# Patient Record
Sex: Female | Born: 1945 | Race: White | Hispanic: No | Smoking: Former smoker
Health system: Southern US, Community
[De-identification: ages and names within clinical notes are randomized; demographics above are authoritative.]

## PROBLEM LIST (undated history)

## (undated) DIAGNOSIS — I10 Essential (primary) hypertension: Secondary | ICD-10-CM

## (undated) DIAGNOSIS — R2689 Other abnormalities of gait and mobility: Secondary | ICD-10-CM

## (undated) DIAGNOSIS — G4730 Sleep apnea, unspecified: Secondary | ICD-10-CM

## (undated) DIAGNOSIS — M1990 Unspecified osteoarthritis, unspecified site: Secondary | ICD-10-CM

## (undated) DIAGNOSIS — E785 Hyperlipidemia, unspecified: Secondary | ICD-10-CM

## (undated) DIAGNOSIS — G5602 Carpal tunnel syndrome, left upper limb: Secondary | ICD-10-CM

## (undated) DIAGNOSIS — Z8719 Personal history of other diseases of the digestive system: Secondary | ICD-10-CM

## (undated) DIAGNOSIS — Z8673 Personal history of transient ischemic attack (TIA), and cerebral infarction without residual deficits: Secondary | ICD-10-CM

## (undated) DIAGNOSIS — G971 Other reaction to spinal and lumbar puncture: Secondary | ICD-10-CM

## (undated) DIAGNOSIS — K219 Gastro-esophageal reflux disease without esophagitis: Secondary | ICD-10-CM

## (undated) DIAGNOSIS — M25512 Pain in left shoulder: Secondary | ICD-10-CM

## (undated) DIAGNOSIS — E118 Type 2 diabetes mellitus with unspecified complications: Secondary | ICD-10-CM

## (undated) DIAGNOSIS — E1140 Type 2 diabetes mellitus with diabetic neuropathy, unspecified: Secondary | ICD-10-CM

## (undated) DIAGNOSIS — M5416 Radiculopathy, lumbar region: Secondary | ICD-10-CM

## (undated) DIAGNOSIS — E119 Type 2 diabetes mellitus without complications: Secondary | ICD-10-CM

## (undated) DIAGNOSIS — T4145XA Adverse effect of unspecified anesthetic, initial encounter: Secondary | ICD-10-CM

## (undated) HISTORY — PX: OTHER SURGICAL HISTORY: SHX169

## (undated) HISTORY — PX: TONSILLECTOMY: SUR1361

## (undated) HISTORY — DX: Personal history of transient ischemic attack (TIA), and cerebral infarction without residual deficits: Z86.73

## (undated) HISTORY — DX: Type 2 diabetes mellitus with diabetic neuropathy, unspecified: E11.40

## (undated) HISTORY — DX: Type 2 diabetes mellitus with unspecified complications: E11.8

## (undated) HISTORY — DX: Hyperlipidemia, unspecified: E78.5

## (undated) HISTORY — DX: Unspecified osteoarthritis, unspecified site: M19.90

## (undated) HISTORY — DX: Gastro-esophageal reflux disease without esophagitis: K21.9

## (undated) HISTORY — DX: Other abnormalities of gait and mobility: R26.89

## (undated) HISTORY — DX: Type 2 diabetes mellitus without complications: E11.9

## (undated) HISTORY — DX: Radiculopathy, lumbar region: M54.16

## (undated) HISTORY — DX: Essential (primary) hypertension: I10

---
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
## 1975-05-25 DIAGNOSIS — T8859XA Other complications of anesthesia, initial encounter: Secondary | ICD-10-CM

## 1975-05-25 DIAGNOSIS — G971 Other reaction to spinal and lumbar puncture: Secondary | ICD-10-CM

## 1975-05-25 HISTORY — DX: Other complications of anesthesia, initial encounter: T88.59XA

## 1975-05-25 HISTORY — DX: Other reaction to spinal and lumbar puncture: G97.1

## 2002-06-07 ENCOUNTER — Encounter: Payer: Self-pay | Admitting: Orthopedic Surgery

## 2002-06-07 ENCOUNTER — Encounter: Admission: RE | Admit: 2002-06-07 | Discharge: 2002-06-07 | Payer: Self-pay | Admitting: Orthopedic Surgery

## 2006-02-26 ENCOUNTER — Encounter: Admission: RE | Admit: 2006-02-26 | Discharge: 2006-02-26 | Payer: Self-pay | Admitting: Unknown Physician Specialty

## 2006-05-08 ENCOUNTER — Encounter: Admission: RE | Admit: 2006-05-08 | Discharge: 2006-05-08 | Payer: Self-pay | Admitting: Rheumatology

## 2006-06-06 ENCOUNTER — Ambulatory Visit: Payer: Self-pay | Admitting: Internal Medicine

## 2006-06-21 ENCOUNTER — Ambulatory Visit: Payer: Self-pay

## 2008-09-01 ENCOUNTER — Emergency Department (HOSPITAL_COMMUNITY): Admission: EM | Admit: 2008-09-01 | Discharge: 2008-09-01 | Payer: Self-pay | Admitting: Internal Medicine

## 2010-05-02 LAB — DIFFERENTIAL
Basophils Absolute: 0.1 10*3/uL (ref 0.0–0.1)
Eosinophils Absolute: 0.1 10*3/uL (ref 0.0–0.7)
Eosinophils Relative: 2 % (ref 0–5)
Lymphs Abs: 2.2 10*3/uL (ref 0.7–4.0)
Neutrophils Relative %: 61 % (ref 43–77)

## 2010-05-02 LAB — CBC
HCT: 41.6 % (ref 36.0–46.0)
MCV: 83.9 fL (ref 78.0–100.0)
Platelets: 383 10*3/uL (ref 150–400)
RDW: 14.2 % (ref 11.5–15.5)
WBC: 7.6 10*3/uL (ref 4.0–10.5)

## 2010-05-02 LAB — GLUCOSE, CAPILLARY: Glucose-Capillary: 100 mg/dL — ABNORMAL HIGH (ref 70–99)

## 2010-05-02 LAB — APTT: aPTT: 26 seconds (ref 24–37)

## 2010-05-02 LAB — PROTIME-INR
INR: 1 (ref 0.00–1.49)
Prothrombin Time: 12.6 seconds (ref 11.6–15.2)

## 2013-05-22 ENCOUNTER — Ambulatory Visit (INDEPENDENT_AMBULATORY_CARE_PROVIDER_SITE_OTHER): Payer: 59 | Admitting: Surgery

## 2013-05-22 ENCOUNTER — Encounter (INDEPENDENT_AMBULATORY_CARE_PROVIDER_SITE_OTHER): Payer: Self-pay | Admitting: Surgery

## 2013-05-22 VITALS — BP 142/80 | HR 80 | Temp 97.8°F | Resp 18 | Ht 60.0 in | Wt 211.4 lb

## 2013-05-22 DIAGNOSIS — E119 Type 2 diabetes mellitus without complications: Secondary | ICD-10-CM

## 2013-05-22 DIAGNOSIS — I10 Essential (primary) hypertension: Secondary | ICD-10-CM

## 2013-05-22 DIAGNOSIS — K219 Gastro-esophageal reflux disease without esophagitis: Secondary | ICD-10-CM

## 2013-05-22 DIAGNOSIS — E1129 Type 2 diabetes mellitus with other diabetic kidney complication: Secondary | ICD-10-CM | POA: Insufficient documentation

## 2013-05-22 DIAGNOSIS — E785 Hyperlipidemia, unspecified: Secondary | ICD-10-CM | POA: Insufficient documentation

## 2013-05-22 HISTORY — DX: Hyperlipidemia, unspecified: E78.5

## 2013-05-22 HISTORY — DX: Essential (primary) hypertension: I10

## 2013-05-22 HISTORY — DX: Type 2 diabetes mellitus with other diabetic kidney complication: E11.29

## 2013-05-22 NOTE — Progress Notes (Signed)
Chief Complaint:  Morbid obesity BMI 41.5  History of Present Illness:  Tina Ortega is an 68 y.o. female who has worked 2 jobs for over 30 years to raise her family by herself.  She has DM and is followed by Dr. Alyson LocketImaron Haque in [place].  She has been to our seminar and wants to proceed with a bariatric intervention.  We discussed the options and she would like to pursue sleeve gastrectomy to assist with management of her DM.  Will proceed with workup.   Past Medical History  Diagnosis Date  . GERD (gastroesophageal reflux disease)   . Hypertension   . Hyperlipidemia   . Diabetes mellitus without complication     Past Surgical History  Procedure Laterality Date  . Ovarian cyst surgery  1965  . Appendectomy  1965  . Cesarean section  1975, 1977    Current Outpatient Prescriptions  Medication Sig Dispense Refill  . DULoxetine (CYMBALTA) 60 MG capsule       . meloxicam (MOBIC) 15 MG tablet       . NEXIUM 40 MG capsule       . ONGLYZA 5 MG TABS tablet        No current facility-administered medications for this visit.   Penicillins Family History  Problem Relation Age of Onset  . Diabetes Mother   . Heart disease Mother   . Arthritis Father   . Arthritis Sister    Social History:   reports that she quit smoking about 28 years ago. She does not have any smokeless tobacco history on file. She reports that she does not use illicit drugs. Her alcohol history is not on file.   REVIEW OF SYSTEMS - PERTINENT POSITIVES ONLY: No history of DVT;  2 c sections  Positive for night sweats, blood pressure, kidney stones, rectal bleeding, reflux hoarseness and sore throat, glasses, otherwise negative  Physical Exam:   Blood pressure 142/80, pulse 80, temperature 97.8 F (36.6 C), temperature source Temporal, resp. rate 18, height 5' (1.524 m), weight 211 lb 6.4 oz (95.89 kg). Body mass index is 41.29 kg/(m^2).  Gen:  WDWN WF NAD  Neurological: Alert and oriented to person, place,  and time. Motor and sensory function is grossly intact  Head: Normocephalic and atraumatic.  Eyes: Conjunctivae are normal. Pupils are equal, round, and reactive to light. No scleral icterus.  Neck: Normal range of motion. Neck supple. No tracheal deviation or thyromegaly present.  Cardiovascular:  SR without murmurs or gallops.  No carotid bruits Respiratory: Effort normal.  No respiratory distress. No chest wall tenderness. Breath sounds normal.  No wheezes, rales or rhonchi.  Abdomen:  Obese-centripedal GU: Musculoskeletal: Normal range of motion. Extremities are nontender. No cyanosis, edema or clubbing noted Lymphadenopathy: No cervical, preauricular, postauricular or axillary adenopathy is present Skin: Skin is warm and dry. No rash noted. No diaphoresis. No erythema. No pallor. Pscyh: Normal mood and affect. Behavior is normal. Judgment and thought content normal.   LABORATORY RESULTS: No results found for this or any previous visit (from the past 48 hour(s)).  RADIOLOGY RESULTS: No results found.  Problem List: Patient Active Problem List   Diagnosis Date Noted  . Diabetes 05/22/2013  . GERD (gastroesophageal reflux disease) 05/22/2013  . Other and unspecified hyperlipidemia 05/22/2013  . Essential hypertension, benign 05/22/2013    Assessment & Plan: Younger and physiologically in better shape that age.  Will move toward lap sleeve gastrectomy    Matt B. Daphine DeutscherMartin, MD, FACS  [facility] [phone] beeper [phone]  05/22/2013 4:59 PM

## 2013-05-22 NOTE — Patient Instructions (Signed)
Sleeve Gastrectomy A sleeve gastrectomy is a surgery in which a large portion of the stomach is removed. After the surgery, the stomach will be a narrow tube about the size of a banana. This surgery is performed to help a person lose weight. The person loses weight because the reduced size of the stomach restricts the amount of food that the person can eat. The stomach will hold much less food than before the surgery. Also, the part of the stomach that is removed produces a hormone that causes hunger.  This surgery is done for people who have morbid obesity, defined as a body mass index (BMI) greater than 40. BMI is an estimate of body fat and is calculated from the height and weight of a person. This surgery may also be done for people with a BMI between 35 and 40 if they have other diseases, such as type 2 diabetes mellitus, obstructive sleep apnea, or heart and lung disorders (cardiopulmonary diseases).  LET YOUR HEALTH CARE PROVIDER KNOW ABOUT:  Any allergies you have.   All medicines you are taking, including vitamins, herbs, eyedrops, creams, and over-the-counter medicines.   Use of steroids (by mouth or creams).   Previous problems you or members of your family have had with the use of anesthetics.   Any blood disorders you have.   Previous surgeries you have had.   Possibility of pregnancy, if this applies.   Other health problems you have. RISKS AND COMPLICATIONS Generally, sleeve gastrectomy is a safe procedure. However, as with any procedure, complications can occur. Possible complications include:  Infection.  Bleeding.  Blood clots.  Damage to other organs or tissue.  Leakage of fluid from the stomach into the abdominal cavity (rare). BEFORE THE PROCEDURE  You may need to have blood tests and imaging tests (such as X-rays or ultrasonography) done before the day of surgery. A test to evaluate your esophagus and how it moves (esophageal manometry) may also be  done.  You may be placed on a liquid diet 2 3 weeks before the surgery.  Ask your health care provider about changing or stopping your regular medicines.  Do not eat or drink anything for at least 8 hours before the procedure.   Make plans to have someone drive you home after your hospital stay. Also arrange for someone to help you with activities during recovery. PROCEDURE  A laparoscopic technique is usually used for this surgery:  You will be given medicine to make you sleep through the procedure (general anesthetic). This medicine will be given through an intravenous (IV) access tube that is put into one of your veins.  Once you are asleep, your abdomen will be cleaned and sterilized.  Several small incisions will be made in your abdomen.  Your abdomen will be filled with air so that it expands. This gives the surgeon more room to operate and makes your organs easier to see.  A thin, lighted tube with a tiny camera on the end (laparoscope) is put through a small incision in your abdomen. The camera on the laparoscope sends a picture to a TV screen in the operating room. This gives the surgeon a good view inside the abdomen.  Hollow tubes are put through the other small incisions in your abdomen. The tools needed for the procedure are put through these tubes.  The surgeon uses staples to divide part of the stomach and then removes it through one of the incisions.  The remaining stomach may be reinforced using   stitches or surgical glue or both to prevent leakage of the stomach contents. A small tube (drain) may be placed through one of the incisions to allow extra fluid to flow from the area.  The incisions are closed with stitches, staples, or glue. AFTER THE PROCEDURE  You will be monitored closely in a recovery area. Once the anesthetic has worn off, you will likely be moved to a regular hospital room.  You will be given medicine for pain and nausea.   You may have a drain  from one of the incisions in your abdomen. If a drain is used, it may stay in place after you go home from the hospital and be removed at a follow-up appointment.   You will be encouraged to walk around several times a day. This helps prevent blood clots.  You will be started on a liquid diet the first day after your surgery. Sometimes a test is done to check for leaking before you can eat.  You will be urged to cough and do deep breathing exercises. This helps prevent a lung infection after a surgery.  You will likely need to stay in the hospital for a few days.  Document Released: 11/07/2008 Document Revised: 09/12/2012 Document Reviewed: 05/25/2012 ExitCare Patient Information 2014 [employer].  

## 2013-05-22 NOTE — Addendum Note (Signed)
Addended by: Maryan PulsMOORE, Mayvis Agudelo on: 05/22/2013 05:21 PM   Modules accepted: Orders

## 2013-05-24 NOTE — Addendum Note (Signed)
Addended by: Maryan PulsMOORE, Linzee Depaul on: 05/24/2013 04:29 PM   Modules accepted: Orders

## 2013-06-19 ENCOUNTER — Encounter: Payer: Self-pay | Admitting: Dietician

## 2013-06-19 ENCOUNTER — Encounter: Payer: 59 | Attending: Surgery | Admitting: Dietician

## 2013-06-19 VITALS — Ht 61.0 in | Wt 213.2 lb

## 2013-06-19 DIAGNOSIS — Z713 Dietary counseling and surveillance: Secondary | ICD-10-CM | POA: Insufficient documentation

## 2013-06-19 DIAGNOSIS — E669 Obesity, unspecified: Secondary | ICD-10-CM

## 2013-06-19 DIAGNOSIS — Z01818 Encounter for other preprocedural examination: Secondary | ICD-10-CM | POA: Insufficient documentation

## 2013-06-19 NOTE — Progress Notes (Signed)
  Pre-Op Assessment Visit:  Pre-Operative Sleeve Gastrectomy Surgery  Medical Nutrition Therapy:  Appt start time: 1630   End time:  1715.  Patient was seen on 06/19/2013 for Pre-Operative Sleeve Gastrectomy Nutrition Assessment. Assessment and letter of approval faxed to [facility] Bariatric Surgery Program coordinator on 06/19/2013.   Preferred Learning Style:   No preference indicated   Learning Readiness:   Ready  Handouts given during visit include:  Pre-Op Goals Bariatric Surgery Protein Shakes  Supplements given during visit include:  Premier Chocolate lot # [id number], exp 01/2014 Unjury Chocolate lot #[id number], exp 01/25/2014  Teaching Method Utilized:  Visual Auditory Hands on  Barriers to learning/adherence to lifestyle change: none  Demonstrated degree of understanding via:  Teach Back   Patient to call the [facility] to enroll in Pre-Op and Post-Op Nutrition Education when surgery date is scheduled.

## 2013-06-19 NOTE — Patient Instructions (Signed)
Start working on [employer]. Try protein shakes. Call [facility] when surgery is scheduled to enroll in Pre-Op Class.

## 2013-06-20 LAB — CBC WITH DIFFERENTIAL/PLATELET
BASOS PCT: 0 % (ref 0–1)
Basophils Absolute: 0 10*3/uL (ref 0.0–0.1)
EOS ABS: 0.1 10*3/uL (ref 0.0–0.7)
EOS PCT: 2 % (ref 0–5)
HEMATOCRIT: 43.4 % (ref 36.0–46.0)
HEMOGLOBIN: 14.5 g/dL (ref 12.0–15.0)
Lymphocytes Relative: 31 % (ref 12–46)
Lymphs Abs: 2.3 10*3/uL (ref 0.7–4.0)
MCH: 28.9 pg (ref 26.0–34.0)
MCHC: 33.4 g/dL (ref 30.0–36.0)
MCV: 86.6 fL (ref 78.0–100.0)
MONO ABS: 0.5 10*3/uL (ref 0.1–1.0)
MONOS PCT: 7 % (ref 3–12)
NEUTROS ABS: 4.4 10*3/uL (ref 1.7–7.7)
Neutrophils Relative %: 60 % (ref 43–77)
Platelets: 273 10*3/uL (ref 150–400)
RBC: 5.01 MIL/uL (ref 3.87–5.11)
RDW: 13.6 % (ref 11.5–15.5)
WBC: 7.3 10*3/uL (ref 4.0–10.5)

## 2013-06-20 LAB — T4: T4 TOTAL: 8.2 ug/dL (ref 5.0–12.5)

## 2013-06-20 LAB — LIPID PANEL
CHOLESTEROL: 195 mg/dL (ref 0–200)
HDL: 39 mg/dL — ABNORMAL LOW (ref >39)
LDL CALC: 108 mg/dL — AB (ref 0–99)
Total CHOL/HDL Ratio: 5 Ratio
Triglycerides: 238 mg/dL — ABNORMAL HIGH (ref <150)
VLDL: 48 mg/dL — AB (ref 0–40)

## 2013-06-20 LAB — HEMOGLOBIN A1C
HEMOGLOBIN A1C: 6.9 % — AB (ref <5.7)
Mean Plasma Glucose: 151 mg/dL — ABNORMAL HIGH (ref <117)

## 2013-06-20 LAB — H. PYLORI ANTIBODY, IGG: H Pylori IgG: <0.4 {ISR}

## 2013-06-20 LAB — COMPREHENSIVE METABOLIC PANEL
ALBUMIN: 4.4 g/dL (ref 3.5–5.2)
ALK PHOS: 110 U/L (ref 39–117)
ALT: 17 U/L (ref 0–35)
AST: 16 U/L (ref 0–37)
BUN: 21 mg/dL (ref 6–23)
CO2: 30 mEq/L (ref 19–32)
Calcium: 9 mg/dL (ref 8.4–10.5)
Chloride: 102 mEq/L (ref 96–112)
Creat: 0.79 mg/dL (ref 0.50–1.10)
GLUCOSE: 169 mg/dL — AB (ref 70–99)
POTASSIUM: 4.2 meq/L (ref 3.5–5.3)
Sodium: 138 mEq/L (ref 135–145)
Total Bilirubin: 0.6 mg/dL (ref 0.2–1.2)
Total Protein: 6.8 g/dL (ref 6.0–8.3)

## 2013-06-20 LAB — TSH: TSH: 2.816 u[IU]/mL (ref 0.350–4.500)

## 2013-06-21 ENCOUNTER — Ambulatory Visit (HOSPITAL_BASED_OUTPATIENT_CLINIC_OR_DEPARTMENT_OTHER): Payer: 59 | Attending: Surgery | Admitting: Radiology

## 2013-06-21 VITALS — Ht 60.0 in | Wt 213.0 lb

## 2013-06-21 DIAGNOSIS — I10 Essential (primary) hypertension: Secondary | ICD-10-CM

## 2013-06-21 DIAGNOSIS — G4733 Obstructive sleep apnea (adult) (pediatric): Secondary | ICD-10-CM | POA: Insufficient documentation

## 2013-06-21 DIAGNOSIS — E785 Hyperlipidemia, unspecified: Secondary | ICD-10-CM

## 2013-06-21 DIAGNOSIS — E119 Type 2 diabetes mellitus without complications: Secondary | ICD-10-CM

## 2013-06-21 DIAGNOSIS — K219 Gastro-esophageal reflux disease without esophagitis: Secondary | ICD-10-CM

## 2013-06-24 ENCOUNTER — Other Ambulatory Visit: Payer: Self-pay

## 2013-06-24 ENCOUNTER — Encounter (INDEPENDENT_AMBULATORY_CARE_PROVIDER_SITE_OTHER): Payer: Self-pay

## 2013-06-24 ENCOUNTER — Ambulatory Visit (HOSPITAL_COMMUNITY)
Admission: RE | Admit: 2013-06-24 | Discharge: 2013-06-24 | Disposition: A | Discharge status: Home / Self Care | Payer: 59 | Source: Ambulatory Visit | Attending: Surgery | Admitting: Surgery

## 2013-06-24 DIAGNOSIS — E119 Type 2 diabetes mellitus without complications: Secondary | ICD-10-CM

## 2013-06-24 DIAGNOSIS — Z6841 Body Mass Index (BMI) 40.0 and over, adult: Secondary | ICD-10-CM | POA: Insufficient documentation

## 2013-06-24 DIAGNOSIS — K219 Gastro-esophageal reflux disease without esophagitis: Secondary | ICD-10-CM

## 2013-06-24 DIAGNOSIS — K449 Diaphragmatic hernia without obstruction or gangrene: Secondary | ICD-10-CM | POA: Insufficient documentation

## 2013-06-24 DIAGNOSIS — E785 Hyperlipidemia, unspecified: Secondary | ICD-10-CM

## 2013-06-24 DIAGNOSIS — I10 Essential (primary) hypertension: Secondary | ICD-10-CM

## 2013-06-24 DIAGNOSIS — J9819 Other pulmonary collapse: Secondary | ICD-10-CM | POA: Insufficient documentation

## 2013-06-24 DIAGNOSIS — I517 Cardiomegaly: Secondary | ICD-10-CM | POA: Insufficient documentation

## 2013-06-24 LAB — VITAMIN D 1,25 DIHYDROXY
VITAMIN D3 1, 25 (OH): 96 pg/mL
Vitamin D 1, 25 (OH)2 Total: 96 pg/mL — ABNORMAL HIGH (ref 18–72)

## 2013-06-29 ENCOUNTER — Ambulatory Visit: Payer: Self-pay | Admitting: Dietician

## 2013-07-04 DIAGNOSIS — G4730 Sleep apnea, unspecified: Secondary | ICD-10-CM

## 2013-07-04 DIAGNOSIS — G4710 Hypersomnia, unspecified: Secondary | ICD-10-CM

## 2013-07-04 NOTE — Sleep Study (Signed)
   NAME: Tina Ortega DATE OF BIRTH:  01/08/1946 MEDICAL RECORD NUMBER [id number]  LOCATION: [facility]  PHYSICIAN: Barbaraann Share  DATE OF STUDY: 06/21/2013  SLEEP STUDY TYPE: Nocturnal Polysomnogram               REFERRING PHYSICIAN: Valarie Merino, MD  INDICATION FOR STUDY: Hypersomnia with sleep apnea  EPWORTH SLEEPINESS SCORE:  8 HEIGHT: 5' (152.4 cm)  WEIGHT: 213 lb (96.616 kg)    Body mass index is 41.6 kg/(m^2).  NECK SIZE: 16.5 in.  MEDICATIONS: Reviewed in the sleep record  SLEEP ARCHITECTURE: The patient had a total sleep time of 436 minutes, with no slow-wave sleep and decreased quantity of REM. Sleep onset latency was normal at 22 minutes, and REM was not achieved until the titration portion of the study. Sleep efficiency was 88% during the diagnostic portion, and 93% during the titration portion of the study.  RESPIRATORY DATA: The patient underwent a split night study where she was found to have 43 obstructive events in the first 171 minutes of sleep. This gave her an AHI of 15 events per hour during the diagnostic portion of the study. The events occurred in all body positions, and there was moderate snoring noted throughout. The patient was then fitted with a small ResMed air fit F10 full face mask, and CPAP titration was initiated. She was found to have an optimal pressure of 12 cm of water, even through supine REM.  OXYGEN DATA: There was transient oxygen desaturation as low as 86% with the patient's obstructive events  CARDIAC DATA: Rare PVC noted  MOVEMENT/PARASOMNIA: The patient had no significant limb movements or other abnormal behaviors noted.  IMPRESSION/ RECOMMENDATION:    1) split-night study reveals mild obstructive sleep apnea, with an AHI of 15 events per hour and oxygen desaturation as low as 86% during the diagnostic portion of the study. The patient was then fitted with a small ResMed air fit F10 full face mask, and  found to have an optimal CPAP pressure of 12 cm of water. She should also be encouraged to work aggressively on weight loss. Alternative treatments for mild sleep apnea can include a trial of weight loss alone, upper airway surgery, dental appliance, and CPAP as stated above. Clinical correlation is suggested.  2) rare PVC noted, but no clinically significant arrhythmias were seen     Barbaraann Share Diplomate, American Board of Sleep Medicine  ELECTRONICALLY SIGNED ON:  07/04/2013, 3:07 PM [facility] PH: [phone]   FX: [phone] ACCREDITED BY THE AMERICAN ACADEMY OF SLEEP MEDICINE

## 2013-07-12 ENCOUNTER — Encounter: Payer: Self-pay | Admitting: Pulmonary Disease

## 2013-07-12 ENCOUNTER — Ambulatory Visit (INDEPENDENT_AMBULATORY_CARE_PROVIDER_SITE_OTHER): Payer: Self-pay | Admitting: Pulmonary Disease

## 2013-07-12 ENCOUNTER — Encounter (HOSPITAL_BASED_OUTPATIENT_CLINIC_OR_DEPARTMENT_OTHER): Payer: Self-pay

## 2013-07-12 VITALS — BP 140/82 | HR 99 | Temp 97.8°F | Ht 60.0 in | Wt 215.2 lb

## 2013-07-12 DIAGNOSIS — G4733 Obstructive sleep apnea (adult) (pediatric): Secondary | ICD-10-CM

## 2013-07-12 HISTORY — DX: Obstructive sleep apnea (adult) (pediatric): G47.33

## 2013-07-12 NOTE — Assessment & Plan Note (Signed)
The patient has mild obstructive sleep apnea by her recent sleep study, but is only mildly symptomatic overall. The good news here is that her degree of sleep apnea represents very little cardiovascular risk going forward. I suspect that she will respond very quickly to bariatric surgery, and that her sleep apnea will resolve with her first weight loss response. However, I have also talked with her about the advantages of CPAP in the postop period, especially while getting pain medications. After a long discussion, the patient would like to hold off on CPAP if possible, but would be willing to wear for a period of time if her surgeon feels strongly about it.

## 2013-07-12 NOTE — Patient Instructions (Signed)
Work on weight reduction after your surgery. Let me know if you change your mind, and would like to try cpap.   Will send a note to Dr. Daphine DeutscherMartin, and will let you know if he feels strongly about you wearing cpap after you surgery.

## 2013-07-12 NOTE — Progress Notes (Signed)
Subjective:    Patient ID: Tina Ortega, female    DOB: 1945-03-26, 68 y.o.   MRN: [id number]  HPI The patient is a 68 year old female who I've been asked to see for management of obstructive sleep apnea. She has had a recent sleep study which showed mild OSA, with an AHI of 15 events per hour. She was treated with CPAP, and found to have an optimal pressure of 12 cm of water. The patient has been noted to have loud snoring, but no one sleeps with her on a consistent basis to comment on observed apneas. She denies any choking arousals. She rarely awakens during the night, and is rested the majority of the mornings. She has very mild sleep pressure during the day with inactivity, and some in the evening watching television. She works 2 jobs and starts her day very early. She denies any sleepiness with driving. She states her weight is up 50 pounds over the last 2 years, and her Epworth score today is 7.   Sleep Questionnaire What time do you typically go to bed?( Between what hours) 9-10pm 9-10pm at 1509 on 07/12/13 by Darrell JewelJennifer R Castillo, CMA How long does it take you to fall asleep? 5 minutes 5 minutes at 1509 on 07/12/13 by Darrell JewelJennifer R Castillo, CMA How many times during the night do you wake up? 1 1 at 1509 on 07/12/13 by Darrell JewelJennifer R Castillo, CMA What time do you get out of bed to start your day? 0430 0430 at 1509 on 07/12/13 by Darrell JewelJennifer R Castillo, CMA Do you drive or operate heavy machinery in your occupation? No No at 1509 on 07/12/13 by Darrell JewelJennifer R Castillo, CMA How much has your weight changed (up or down) over the past two years? (In pounds) 50 lb (22.68 kg) 50 lb (22.68 kg) at 1509 on 07/12/13 by Darrell JewelJennifer R Castillo, CMA Have you ever had a sleep study before? Yes Yes at 1509 on 07/12/13 by Darrell JewelJennifer R Castillo, CMA If yes, location of study? [place] at 1509 on 07/12/13 by Darrell JewelJennifer R Castillo, CMA If yes, date of study? [id number] at 1509 on  07/12/13 by Darrell JewelJennifer R Castillo, CMA Do you currently use CPAP? No No at 1509 on 07/12/13 by Darrell JewelJennifer R Castillo, CMA Do you wear oxygen at any time? No    Review of Systems  Constitutional: Negative for fever and unexpected weight change.  HENT: Negative for congestion, dental problem, ear pain, nosebleeds, postnasal drip, rhinorrhea, sinus pressure, sneezing, sore throat and trouble swallowing.   Eyes: Negative for redness and itching.  Respiratory: Negative for cough, chest tightness, shortness of breath and wheezing.   Cardiovascular: Negative for palpitations and leg swelling.  Gastrointestinal: Negative for nausea and vomiting.  Genitourinary: Negative for dysuria.  Musculoskeletal: Negative for joint swelling.  Skin: Negative for rash.  Neurological: Negative for headaches.  Hematological: Does not bruise/bleed easily.  Psychiatric/Behavioral: Positive for dysphoric mood. The patient is not nervous/anxious.        Objective:   Physical Exam Constitutional:  Obese female, no acute distress  HENT:  Nares patent without discharge  Oropharynx without exudate, palate and uvula are mildly elongated  Eyes:  Perrla, eomi, no scleral icterus  Neck:  No JVD, no TMG  Cardiovascular:  Normal rate, regular rhythm, no rubs or gallops.  No murmurs        Intact distal pulses  Pulmonary :  Normal breath sounds, no stridor or respiratory distress   No rales,  rhonchi, or wheezing  Abdominal:  Soft, nondistended, bowel sounds present.  No tenderness noted.   Musculoskeletal:  mild lower extremity edema noted.  Lymph Nodes:  No cervical lymphadenopathy noted  Skin:  No cyanosis noted  Neurologic:  Alert, appropriate, moves all 4 extremities without obvious deficit.         Assessment & Plan:

## 2013-09-02 ENCOUNTER — Encounter: Payer: 59 | Attending: Surgery

## 2013-09-02 VITALS — Ht 61.0 in | Wt 209.0 lb

## 2013-09-02 DIAGNOSIS — Z713 Dietary counseling and surveillance: Secondary | ICD-10-CM | POA: Insufficient documentation

## 2013-09-02 DIAGNOSIS — E669 Obesity, unspecified: Secondary | ICD-10-CM

## 2013-09-02 DIAGNOSIS — Z01818 Encounter for other preprocedural examination: Secondary | ICD-10-CM | POA: Diagnosis not present

## 2013-09-05 NOTE — Progress Notes (Signed)
  Pre-Operative Nutrition Class:  Appt start time: 8469   End time:  1830.  Patient was seen on 09/02/13 for Pre-Operative Bariatric Surgery Education at [facility].   Surgery date: 10/01/13 Surgery type: Gastric sleeve Start weight at [facility]: 213 lbs on 06/19/13 Weight today: 209 lbs  TANITA  BODY COMP RESULTS  None for pre op class   BMI (kg/m^2)    Fat Mass (lbs)    Fat Free Mass (lbs)    Total Body Water (lbs)    Samples given per MNT protocol. Patient educated on appropriate usage: Unjury protein powder (unflavored - qty 1) Lot #: [id number] Exp: 10/2014  Bariactiv Calcium Citrate (qty 1) Lot #: [id number] Exp: 06/2014  Celebrate Vitamins Multivitamin (qty 1) Lot #: [id number] Exp: 04/2014  Premier protein shake (chocolate - qty 1) Lot #: [id number] Exp: 04/2014   The following the learning objectives were met by the patient during this course:  Identify Pre-Op Dietary Goals and will begin 2 weeks pre-operatively  Identify appropriate sources of fluids and proteins   State protein recommendations and appropriate sources pre and post-operatively  Identify Post-Operative Dietary Goals and will follow for 2 weeks post-operatively  Identify appropriate multivitamin and calcium sources  Describe the need for physical activity post-operatively and will follow MD recommendations  State when to call healthcare provider regarding medication questions or post-operative complications  Handouts given during class include:  Pre-Op Bariatric Surgery Diet Handout  Protein Shake Handout  Post-Op Bariatric Surgery Nutrition Handout  [facility] Program Information Flyer  Support Group Information Flyer  [facility] Bariatric Supplements Price List  Follow-Up Plan: Patient will follow-up at [facility] 2 weeks post operatively for diet advancement per MD.

## 2013-09-18 NOTE — Progress Notes (Signed)
To Dr. Daphine Deutscher - Please enter preop orders in epic for Tina Ortega - she is coming to [facility] on 9/4 for preop / labs.  Thanks.

## 2013-09-24 ENCOUNTER — Encounter (HOSPITAL_COMMUNITY): Payer: Self-pay | Admitting: Pharmacy Technician

## 2013-09-25 ENCOUNTER — Encounter (INDEPENDENT_AMBULATORY_CARE_PROVIDER_SITE_OTHER): Payer: Self-pay

## 2013-09-25 ENCOUNTER — Other Ambulatory Visit (INDEPENDENT_AMBULATORY_CARE_PROVIDER_SITE_OTHER): Payer: Self-pay | Admitting: Surgery

## 2013-09-25 ENCOUNTER — Ambulatory Visit (INDEPENDENT_AMBULATORY_CARE_PROVIDER_SITE_OTHER): Payer: 59 | Admitting: Surgery

## 2013-09-25 NOTE — Progress Notes (Signed)
Chief Complaint: Morbid obesity BMI 41.5  History of Present Illness: Tina Ortega is an 68 y.o. female who has worked 2 jobs for over 30 years to raise her family by herself. She has DM and is followed by Dr. Alyson Locket in [place]. She has been to our seminar and wants to proceed with a bariatric intervention. We discussed the options and she would like to pursue sleeve gastrectomy to assist with management of her DM. Workup showed small sliding hiatus hernia on UGI.  Informed consent obtained.   completed.    Past Medical History   Diagnosis  Date   .  GERD (gastroesophageal reflux disease)    .  Hypertension    .  Hyperlipidemia    .  Diabetes mellitus without complication     Past Surgical History   Procedure  Laterality  Date   .  Ovarian cyst surgery   1965   .  Appendectomy   1965   .  Cesarean section   1975, 1977    Current Outpatient Prescriptions   Medication  Sig  Dispense  Refill   .  DULoxetine (CYMBALTA) 60 MG capsule      .  meloxicam (MOBIC) 15 MG tablet      .  NEXIUM 40 MG capsule      .  ONGLYZA 5 MG TABS tablet       No current facility-administered medications for this visit.   Penicillins  Family History   Problem  Relation  Age of Onset   .  Diabetes  Mother    .  Heart disease  Mother    .  Arthritis  Father    .  Arthritis  Sister    Social History: reports that she quit smoking about 28 years ago. She does not have any smokeless tobacco history on file. She reports that she does not use illicit drugs. Her alcohol history is not on file.  REVIEW OF SYSTEMS - PERTINENT POSITIVES ONLY:  No history of DVT; 2 c sections Positive for night sweats, blood pressure, kidney stones, rectal bleeding, reflux hoarseness and sore throat, glasses, otherwise negative  Physical Exam:  Blood pressure 142/80, pulse 80, temperature 97.8 F (36.6 C), temperature source Temporal, resp. rate 18, height 5' (1.524 m), weight 211 lb 6.4 oz (95.89 kg).  Body mass index is  41.29 kg/(m^2).  Gen: WDWN WF NAD  Neurological: Alert and oriented to person, place, and time. Motor and sensory function is grossly intact  Head: Normocephalic and atraumatic.  Eyes: Conjunctivae are normal. Pupils are equal, round, and reactive to light. No scleral icterus.  Neck: Normal range of motion. Neck supple. No tracheal deviation or thyromegaly present.  Cardiovascular: SR without murmurs or gallops. No carotid bruits  Respiratory: Effort normal. No respiratory distress. No chest wall tenderness. Breath sounds normal. No wheezes, rales or rhonchi.  Abdomen: Obese-centripedal  GU:  Musculoskeletal: Normal range of motion. Extremities are nontender. No cyanosis, edema or clubbing noted Lymphadenopathy: No cervical, preauricular, postauricular or axillary adenopathy is present Skin: Skin is warm and dry. No rash noted. No diaphoresis. No erythema. No pallor. Pscyh: Normal mood and affect. Behavior is normal. Judgment and thought content normal.  LABORATORY RESULTS:  No results found for this or any previous visit (from the past 48 hour(s)).  RADIOLOGY RESULTS:  No results found.  Problem List:  Patient Active Problem List    Diagnosis  Date Noted   .  Diabetes  05/22/2013   .  GERD (gastroesophageal reflux disease)  05/22/2013   .  Other and unspecified hyperlipidemia  05/22/2013   .  Essential hypertension, benign  05/22/2013   Assessment & Plan:  Younger and physiologically in better shape that age. Ready for sleeve gastrectomy.  UGI showed small sliding hiatua hernia.  I have discussed the procedure again with her in detail.  She is ready for surgery next week.  Matt B. Daphine Deutscher, MD, [facility]  [phone] beeper  [phone]

## 2013-09-26 NOTE — Patient Instructions (Addendum)
20 Tina Ortega  09/26/2013   Your procedure is scheduled on: Tuesday September 8th, 2015  Report to [facility] Main Entrance and follow signs to  Short Stay Center at 700 AM.  Call this number if you have problems the morning of surgery [phone]   Remember:  Do not eat food or drink liquids :After Midnight.     Take these medicines the morning of surgery with A SIP OF WATER: NEXIUM, CYMBALTA                               You may not have any metal on your body including hair pins and piercings  Do not wear jewelry, make-up, lotions, powders, or deodorant.   Men may shave face and neck.  Do not bring valuables to the hospital. [facility] IS NOT RESPONSIBLE FOR VALUABLES.  Contacts, dentures or bridgework may not be worn into surgery.  Leave suitcase in the car. After surgery it may be brought to your room.  For patients admitted to the hospital, checkout time is 11:00 AM the day of discharge.   Patients discharged the day of surgery will not be allowed to drive home.  Name and phone number of your driver:  Special Instructions: N/A ________________________________________________________________________  [facility] - Preparing for Surgery Before surgery, you can play an important role.  Because skin is not sterile, your skin needs to be as free of germs as possible.  You can reduce the number of germs on your skin by washing with CHG (chlorahexidine gluconate) soap before surgery.  CHG is an antiseptic cleaner which kills germs and bonds with the skin to continue killing germs even after washing. Please DO NOT use if you have an allergy to CHG or antibacterial soaps.  If your skin becomes reddened/irritated stop using the CHG and inform your nurse when you arrive at Short Stay. Do not shave (including legs and underarms) for at least 48 hours prior to the first CHG shower.  You may shave your face/neck. Please follow these instructions carefully:  1.  Shower with  CHG Soap the night before surgery and the  morning of Surgery.  2.  If you choose to wash your hair, wash your hair first as usual with your  normal  shampoo.  3.  After you shampoo, rinse your hair and body thoroughly to remove the  shampoo.                           4.  Use CHG as you would any other liquid soap.  You can apply chg directly  to the skin and wash                       Gently with a scrungie or clean washcloth.  5.  Apply the CHG Soap to your body ONLY FROM THE NECK DOWN.   Do not use on face/ open                           Wound or open sores. Avoid contact with eyes, ears mouth and genitals (private parts).                       Wash face,  Genitals (private parts) with your normal soap.  6.  Wash thoroughly, paying special attention to the area where your surgery  will be performed.  7.  Thoroughly rinse your body with warm water from the neck down.  8.  DO NOT shower/wash with your normal soap after using and rinsing off  the CHG Soap.                9.  Pat yourself dry with a clean towel.            10.  Wear clean pajamas.            11.  Place clean sheets on your bed the night of your first shower and do not  sleep with pets. Day of Surgery : Do not apply any lotions/deodorants the morning of surgery.  Please wear clean clothes to the hospital/surgery center.  FAILURE TO FOLLOW THESE INSTRUCTIONS MAY RESULT IN THE CANCELLATION OF YOUR SURGERY PATIENT SIGNATURE_________________________________  NURSE SIGNATURE__________________________________  ________________________________________________________________________

## 2013-09-26 NOTE — Progress Notes (Signed)
ekg 06-24-13 epic Chest xray 2 view 06-24-13 epic

## 2013-09-27 ENCOUNTER — Encounter (HOSPITAL_COMMUNITY)
Admission: RE | Admit: 2013-09-27 | Discharge: 2013-09-27 | Disposition: A | Discharge status: Home / Self Care | Payer: 59 | Source: Ambulatory Visit | Attending: Surgery | Admitting: Surgery

## 2013-09-27 ENCOUNTER — Encounter (HOSPITAL_COMMUNITY): Payer: Self-pay

## 2013-09-27 DIAGNOSIS — Z01818 Encounter for other preprocedural examination: Secondary | ICD-10-CM | POA: Diagnosis present

## 2013-09-27 DIAGNOSIS — Z6839 Body mass index (BMI) 39.0-39.9, adult: Secondary | ICD-10-CM | POA: Insufficient documentation

## 2013-09-27 HISTORY — DX: Pain in left shoulder: M25.512

## 2013-09-27 HISTORY — DX: Other reaction to spinal and lumbar puncture: G97.1

## 2013-09-27 HISTORY — DX: Carpal tunnel syndrome, left upper limb: G56.02

## 2013-09-27 HISTORY — DX: Personal history of other diseases of the digestive system: Z87.19

## 2013-09-27 HISTORY — DX: Adverse effect of unspecified anesthetic, initial encounter: T41.45XA

## 2013-09-27 HISTORY — DX: Unspecified osteoarthritis, unspecified site: M19.90

## 2013-09-27 HISTORY — DX: Sleep apnea, unspecified: G47.30

## 2013-09-27 LAB — COMPREHENSIVE METABOLIC PANEL
ALBUMIN: 4 g/dL (ref 3.5–5.2)
ALK PHOS: 121 U/L — AB (ref 39–117)
ALT: 16 U/L (ref 0–35)
AST: 18 U/L (ref 0–37)
Anion gap: 11 (ref 5–15)
BILIRUBIN TOTAL: 0.7 mg/dL (ref 0.3–1.2)
BUN: 20 mg/dL (ref 6–23)
CHLORIDE: 101 meq/L (ref 96–112)
CO2: 29 mEq/L (ref 19–32)
Calcium: 10.3 mg/dL (ref 8.4–10.5)
Creatinine, Ser: 0.61 mg/dL (ref 0.50–1.10)
GFR calc Af Amer: >90 mL/min (ref >90)
GFR calc non Af Amer: >90 mL/min (ref >90)
Glucose, Bld: 85 mg/dL (ref 70–99)
POTASSIUM: 4.9 meq/L (ref 3.7–5.3)
SODIUM: 141 meq/L (ref 137–147)
Total Protein: 7.7 g/dL (ref 6.0–8.3)

## 2013-09-27 LAB — CBC WITH DIFFERENTIAL/PLATELET
BASOS ABS: 0 10*3/uL (ref 0.0–0.1)
BASOS PCT: 0 % (ref 0–1)
Eosinophils Absolute: 0.2 10*3/uL (ref 0.0–0.7)
Eosinophils Relative: 3 % (ref 0–5)
HCT: 47.6 % — ABNORMAL HIGH (ref 36.0–46.0)
HEMOGLOBIN: 15.7 g/dL — AB (ref 12.0–15.0)
Lymphocytes Relative: 31 % (ref 12–46)
Lymphs Abs: 2.5 10*3/uL (ref 0.7–4.0)
MCH: 29.7 pg (ref 26.0–34.0)
MCHC: 33 g/dL (ref 30.0–36.0)
MCV: 90 fL (ref 78.0–100.0)
Monocytes Absolute: 0.6 10*3/uL (ref 0.1–1.0)
Monocytes Relative: 7 % (ref 3–12)
NEUTROS ABS: 4.8 10*3/uL (ref 1.7–7.7)
NEUTROS PCT: 59 % (ref 43–77)
PLATELETS: 307 10*3/uL (ref 150–400)
RBC: 5.29 MIL/uL — ABNORMAL HIGH (ref 3.87–5.11)
RDW: 12.7 % (ref 11.5–15.5)
WBC: 8.1 10*3/uL (ref 4.0–10.5)

## 2013-10-01 ENCOUNTER — Inpatient Hospital Stay (HOSPITAL_COMMUNITY)
Admission: RE | Admit: 2013-10-01 | Discharge: 2013-10-04 | DRG: 621 | Disposition: A | Discharge status: Home / Self Care | Payer: 59 | Source: Ambulatory Visit | Attending: Surgery | Admitting: Surgery

## 2013-10-01 ENCOUNTER — Encounter (HOSPITAL_COMMUNITY)
Admission: RE | Disposition: A | Discharge status: Home / Self Care | Payer: Self-pay | Source: Ambulatory Visit | Attending: Surgery

## 2013-10-01 ENCOUNTER — Encounter (HOSPITAL_COMMUNITY): Payer: Self-pay | Admitting: *Deleted

## 2013-10-01 ENCOUNTER — Inpatient Hospital Stay (HOSPITAL_COMMUNITY): Payer: 59 | Admitting: Anesthesiology

## 2013-10-01 ENCOUNTER — Encounter (HOSPITAL_COMMUNITY): Payer: 59 | Admitting: Anesthesiology

## 2013-10-01 DIAGNOSIS — Z8249 Family history of ischemic heart disease and other diseases of the circulatory system: Secondary | ICD-10-CM | POA: Diagnosis not present

## 2013-10-01 DIAGNOSIS — Z79899 Other long term (current) drug therapy: Secondary | ICD-10-CM | POA: Diagnosis not present

## 2013-10-01 DIAGNOSIS — Z833 Family history of diabetes mellitus: Secondary | ICD-10-CM | POA: Diagnosis not present

## 2013-10-01 DIAGNOSIS — E119 Type 2 diabetes mellitus without complications: Secondary | ICD-10-CM | POA: Diagnosis present

## 2013-10-01 DIAGNOSIS — K449 Diaphragmatic hernia without obstruction or gangrene: Secondary | ICD-10-CM | POA: Diagnosis present

## 2013-10-01 DIAGNOSIS — Z9884 Bariatric surgery status: Secondary | ICD-10-CM

## 2013-10-01 DIAGNOSIS — Z87891 Personal history of nicotine dependence: Secondary | ICD-10-CM

## 2013-10-01 DIAGNOSIS — E785 Hyperlipidemia, unspecified: Secondary | ICD-10-CM | POA: Diagnosis present

## 2013-10-01 DIAGNOSIS — G4733 Obstructive sleep apnea (adult) (pediatric): Secondary | ICD-10-CM | POA: Diagnosis present

## 2013-10-01 DIAGNOSIS — Z6841 Body Mass Index (BMI) 40.0 and over, adult: Secondary | ICD-10-CM

## 2013-10-01 DIAGNOSIS — K219 Gastro-esophageal reflux disease without esophagitis: Secondary | ICD-10-CM | POA: Diagnosis present

## 2013-10-01 DIAGNOSIS — I10 Essential (primary) hypertension: Secondary | ICD-10-CM | POA: Diagnosis present

## 2013-10-01 DIAGNOSIS — Z01812 Encounter for preprocedural laboratory examination: Secondary | ICD-10-CM | POA: Diagnosis not present

## 2013-10-01 HISTORY — PX: LAPAROSCOPIC GASTRIC SLEEVE RESECTION: SHX5895

## 2013-10-01 HISTORY — DX: Bariatric surgery status: Z98.84

## 2013-10-01 LAB — GLUCOSE, CAPILLARY
GLUCOSE-CAPILLARY: 132 mg/dL — AB (ref 70–99)
GLUCOSE-CAPILLARY: 140 mg/dL — AB (ref 70–99)
GLUCOSE-CAPILLARY: 136 mg/dL — AB (ref 70–99)
Glucose-Capillary: 129 mg/dL — ABNORMAL HIGH (ref 70–99)
Glucose-Capillary: 150 mg/dL — ABNORMAL HIGH (ref 70–99)
Glucose-Capillary: 155 mg/dL — ABNORMAL HIGH (ref 70–99)

## 2013-10-01 LAB — CREATININE, SERUM
Creatinine, Ser: 0.58 mg/dL (ref 0.50–1.10)
GFR calc Af Amer: >90 mL/min (ref >90)
GFR calc non Af Amer: >90 mL/min (ref >90)

## 2013-10-01 LAB — CBC
HEMATOCRIT: 41.4 % (ref 36.0–46.0)
HEMOGLOBIN: 13.6 g/dL (ref 12.0–15.0)
MCH: 29.1 pg (ref 26.0–34.0)
MCHC: 32.9 g/dL (ref 30.0–36.0)
MCV: 88.5 fL (ref 78.0–100.0)
Platelets: 257 10*3/uL (ref 150–400)
RBC: 4.68 MIL/uL (ref 3.87–5.11)
RDW: 12.7 % (ref 11.5–15.5)
WBC: 12.7 10*3/uL — ABNORMAL HIGH (ref 4.0–10.5)

## 2013-10-01 LAB — HEMOGLOBIN A1C
Hgb A1c MFr Bld: 6.8 % — ABNORMAL HIGH (ref <5.7)
Mean Plasma Glucose: 148 mg/dL — ABNORMAL HIGH (ref <117)

## 2013-10-01 SURGERY — GASTRECTOMY, SLEEVE, LAPAROSCOPIC
Anesthesia: General | Site: Abdomen

## 2013-10-01 MED ORDER — DEXAMETHASONE SODIUM PHOSPHATE 10 MG/ML IJ SOLN
INTRAMUSCULAR | Status: AC
  Filled 2013-10-01: qty 1

## 2013-10-01 MED ORDER — HEPARIN SODIUM (PORCINE) 5000 UNIT/ML IJ SOLN
5000.0000 [IU] | INTRAMUSCULAR | Status: AC
Start: 2013-10-01 — End: 2013-10-01
  Administered 2013-10-01: 5000 [IU] via SUBCUTANEOUS
  Filled 2013-10-01: qty 1

## 2013-10-01 MED ORDER — SUCCINYLCHOLINE CHLORIDE 20 MG/ML IJ SOLN
INTRAMUSCULAR | Status: DC | PRN
  Administered 2013-10-01: 100 mg via INTRAVENOUS

## 2013-10-01 MED ORDER — NEOSTIGMINE METHYLSULFATE 10 MG/10ML IV SOLN
INTRAVENOUS | Status: AC
  Filled 2013-10-01: qty 1

## 2013-10-01 MED ORDER — UNJURY VANILLA POWDER
2.0000 [oz_av] | Freq: Four times a day (QID) | ORAL | Status: DC
  Administered 2013-10-03 – 2013-10-04 (×2): 2 [oz_av] via ORAL

## 2013-10-01 MED ORDER — CISATRACURIUM BESYLATE (PF) 10 MG/5ML IV SOLN
INTRAVENOUS | Status: DC | PRN
  Administered 2013-10-01: 4 mg via INTRAVENOUS
  Administered 2013-10-01: 7 mg via INTRAVENOUS

## 2013-10-01 MED ORDER — 0.9 % SODIUM CHLORIDE (POUR BTL) OPTIME
TOPICAL | Status: DC | PRN
  Administered 2013-10-01: 1000 mL

## 2013-10-01 MED ORDER — INSULIN ASPART 100 UNIT/ML ~~LOC~~ SOLN
0.0000 [IU] | SUBCUTANEOUS | Status: DC
  Administered 2013-10-01: 3 [IU] via SUBCUTANEOUS
  Administered 2013-10-01: 2 [IU] via SUBCUTANEOUS
  Administered 2013-10-01 – 2013-10-02 (×6): 3 [IU] via SUBCUTANEOUS
  Administered 2013-10-03 (×2): 2 [IU] via SUBCUTANEOUS
  Administered 2013-10-03 – 2013-10-04 (×3): 3 [IU] via SUBCUTANEOUS

## 2013-10-01 MED ORDER — MORPHINE SULFATE 2 MG/ML IJ SOLN
2.0000 mg | INTRAMUSCULAR | Status: DC | PRN
  Administered 2013-10-01: 2 mg via INTRAVENOUS
  Administered 2013-10-01: 5 mg via INTRAVENOUS
  Administered 2013-10-01: 2 mg via INTRAVENOUS
  Administered 2013-10-02: 6 mg via INTRAVENOUS
  Administered 2013-10-02: 2 mg via INTRAVENOUS
  Administered 2013-10-02: 4 mg via INTRAVENOUS
  Administered 2013-10-02: 2 mg via INTRAVENOUS
  Filled 2013-10-01: qty 3
  Filled 2013-10-01 (×3): qty 1
  Filled 2013-10-01: qty 2
  Filled 2013-10-01: qty 3
  Filled 2013-10-01: qty 1

## 2013-10-01 MED ORDER — CISATRACURIUM BESYLATE 20 MG/10ML IV SOLN
INTRAVENOUS | Status: AC
  Filled 2013-10-01: qty 10

## 2013-10-01 MED ORDER — MIDAZOLAM HCL 2 MG/2ML IJ SOLN
INTRAMUSCULAR | Status: AC
  Filled 2013-10-01: qty 2

## 2013-10-01 MED ORDER — UNJURY CHICKEN SOUP POWDER: 2.0000 [oz_av] | Freq: Four times a day (QID) | ORAL | Status: DC

## 2013-10-01 MED ORDER — BUPIVACAINE LIPOSOME 1.3 % IJ SUSP
20.0000 mL | Freq: Once | INTRAMUSCULAR | Status: AC
  Administered 2013-10-01: 20 mL
  Filled 2013-10-01: qty 20

## 2013-10-01 MED ORDER — METOCLOPRAMIDE HCL 5 MG/ML IJ SOLN
INTRAMUSCULAR | Status: DC | PRN
  Administered 2013-10-01: 10 mg via INTRAVENOUS

## 2013-10-01 MED ORDER — LEVOFLOXACIN IN D5W 750 MG/150ML IV SOLN
750.0000 mg | INTRAVENOUS | Status: AC
  Administered 2013-10-01: 750 mg via INTRAVENOUS
  Filled 2013-10-01 (×2): qty 150

## 2013-10-01 MED ORDER — HEPARIN SODIUM (PORCINE) 5000 UNIT/ML IJ SOLN
5000.0000 [IU] | Freq: Three times a day (TID) | INTRAMUSCULAR | Status: DC
  Administered 2013-10-01 – 2013-10-04 (×8): 5000 [IU] via SUBCUTANEOUS
  Filled 2013-10-01 (×11): qty 1

## 2013-10-01 MED ORDER — CHLORHEXIDINE GLUCONATE 4 % EX LIQD: 60.0000 mL | Freq: Once | CUTANEOUS | Status: DC

## 2013-10-01 MED ORDER — PROMETHAZINE HCL 25 MG/ML IJ SOLN: 6.2500 mg | INTRAMUSCULAR | Status: DC | PRN

## 2013-10-01 MED ORDER — ONDANSETRON HCL 4 MG/2ML IJ SOLN
4.0000 mg | INTRAMUSCULAR | Status: DC | PRN
  Administered 2013-10-02: 4 mg via INTRAVENOUS
  Filled 2013-10-01: qty 2

## 2013-10-01 MED ORDER — DEXTROSE-NACL 5-0.45 % IV SOLN
INTRAVENOUS | Status: DC
  Administered 2013-10-01: 17:00:00 via INTRAVENOUS
  Administered 2013-10-02 (×2): 100 mL/h via INTRAVENOUS
  Administered 2013-10-02: 04:00:00 via INTRAVENOUS
  Administered 2013-10-03 – 2013-10-04 (×2): 100 mL/h via INTRAVENOUS

## 2013-10-01 MED ORDER — UNJURY CHOCOLATE CLASSIC POWDER
2.0000 [oz_av] | Freq: Four times a day (QID) | ORAL | Status: DC
  Administered 2013-10-03 – 2013-10-04 (×4): 2 [oz_av] via ORAL

## 2013-10-01 MED ORDER — MIDAZOLAM HCL 5 MG/5ML IJ SOLN
INTRAMUSCULAR | Status: DC | PRN
  Administered 2013-10-01: 2 mg via INTRAVENOUS

## 2013-10-01 MED ORDER — ACETAMINOPHEN 160 MG/5ML PO SOLN: 650.0000 mg | ORAL | Status: DC | PRN

## 2013-10-01 MED ORDER — OXYCODONE HCL 5 MG/5ML PO SOLN
5.0000 mg | ORAL | Status: DC | PRN
  Administered 2013-10-02 (×3): 5 mg via ORAL
  Administered 2013-10-03: 10 mg via ORAL
  Administered 2013-10-03: 5 mg via ORAL
  Filled 2013-10-01 (×2): qty 10
  Filled 2013-10-01 (×2): qty 5

## 2013-10-01 MED ORDER — PHENYLEPHRINE HCL 10 MG/ML IJ SOLN
INTRAMUSCULAR | Status: AC
  Filled 2013-10-01: qty 1

## 2013-10-01 MED ORDER — ONDANSETRON HCL 4 MG/2ML IJ SOLN
INTRAMUSCULAR | Status: DC | PRN
  Administered 2013-10-01: 4 mg via INTRAVENOUS

## 2013-10-01 MED ORDER — NEOSTIGMINE METHYLSULFATE 10 MG/10ML IV SOLN
INTRAVENOUS | Status: DC | PRN
  Administered 2013-10-01: 4 mg via INTRAVENOUS

## 2013-10-01 MED ORDER — HYDROMORPHONE HCL PF 1 MG/ML IJ SOLN
INTRAMUSCULAR | Status: AC
  Filled 2013-10-01: qty 1

## 2013-10-01 MED ORDER — ACETAMINOPHEN 160 MG/5ML PO SOLN: 325.0000 mg | ORAL | Status: DC | PRN

## 2013-10-01 MED ORDER — GLYCOPYRROLATE 0.2 MG/ML IJ SOLN
INTRAMUSCULAR | Status: DC | PRN
  Administered 2013-10-01: 0.6 mg via INTRAVENOUS

## 2013-10-01 MED ORDER — ONDANSETRON HCL 4 MG/2ML IJ SOLN
INTRAMUSCULAR | Status: AC
  Filled 2013-10-01: qty 2

## 2013-10-01 MED ORDER — PROPOFOL 10 MG/ML IV BOLUS
INTRAVENOUS | Status: DC | PRN
  Administered 2013-10-01: 150 mg via INTRAVENOUS

## 2013-10-01 MED ORDER — PROPOFOL 10 MG/ML IV BOLUS
INTRAVENOUS | Status: AC
  Filled 2013-10-01: qty 20

## 2013-10-01 MED ORDER — HYDROMORPHONE HCL PF 1 MG/ML IJ SOLN
0.2500 mg | INTRAMUSCULAR | Status: DC | PRN
  Administered 2013-10-01 (×4): 0.25 mg via INTRAVENOUS

## 2013-10-01 MED ORDER — FENTANYL CITRATE 0.05 MG/ML IJ SOLN
INTRAMUSCULAR | Status: AC
  Filled 2013-10-01: qty 5

## 2013-10-01 MED ORDER — LACTATED RINGERS IV SOLN
INTRAVENOUS | Status: DC | PRN
  Administered 2013-10-01 (×3): via INTRAVENOUS

## 2013-10-01 MED ORDER — PHENYLEPHRINE 40 MCG/ML (10ML) SYRINGE FOR IV PUSH (FOR BLOOD PRESSURE SUPPORT)
PREFILLED_SYRINGE | INTRAVENOUS | Status: AC
  Filled 2013-10-01: qty 10

## 2013-10-01 MED ORDER — FENTANYL CITRATE 0.05 MG/ML IJ SOLN
INTRAMUSCULAR | Status: DC | PRN
  Administered 2013-10-01: 50 ug via INTRAVENOUS
  Administered 2013-10-01: 100 ug via INTRAVENOUS

## 2013-10-01 MED ORDER — GLYCOPYRROLATE 0.2 MG/ML IJ SOLN
INTRAMUSCULAR | Status: AC
  Filled 2013-10-01: qty 3

## 2013-10-01 MED ORDER — DEXAMETHASONE SODIUM PHOSPHATE 10 MG/ML IJ SOLN
INTRAMUSCULAR | Status: DC | PRN
  Administered 2013-10-01: 10 mg via INTRAVENOUS

## 2013-10-01 MED ORDER — LACTATED RINGERS IR SOLN
Status: DC | PRN
  Administered 2013-10-01: 1000 mL

## 2013-10-01 MED ORDER — PHENYLEPHRINE HCL 10 MG/ML IJ SOLN
INTRAMUSCULAR | Status: DC | PRN
  Administered 2013-10-01: 120 ug via INTRAVENOUS
  Administered 2013-10-01: 80 ug via INTRAVENOUS
  Administered 2013-10-01: 120 ug via INTRAVENOUS
  Administered 2013-10-01: 80 ug via INTRAVENOUS

## 2013-10-01 MED ORDER — LACTATED RINGERS IV SOLN: INTRAVENOUS | Status: DC

## 2013-10-01 MED ORDER — METOCLOPRAMIDE HCL 5 MG/ML IJ SOLN
INTRAMUSCULAR | Status: AC
  Filled 2013-10-01: qty 2

## 2013-10-01 MED ORDER — PHENYLEPHRINE HCL 10 MG/ML IJ SOLN
10.0000 mg | INTRAVENOUS | Status: DC | PRN
  Administered 2013-10-01: 50 ug/min via INTRAVENOUS

## 2013-10-01 SURGICAL SUPPLY — 68 items
ADH SKN CLS APL DERMABOND .7 (GAUZE/BANDAGES/DRESSINGS) ×1
APL SRG 32X5 SNPLK LF DISP (MISCELLANEOUS)
APPLICATOR COTTON TIP 6IN STRL (MISCELLANEOUS) IMPLANT
APPLIER CLIP ROT 10 11.4 M/L (STAPLE)
APPLIER CLIP ROT 13.4 12 LRG (CLIP)
APR CLP LRG 13.4X12 ROT 20 MLT (CLIP)
APR CLP MED LRG 11.4X10 (STAPLE)
BLADE HEX COATED 2.75 (ELECTRODE) IMPLANT
BLADE SURG 15 STRL LF DISP TIS (BLADE) ×1 IMPLANT
BLADE SURG 15 STRL SS (BLADE) ×3
CABLE HIGH FREQUENCY MONO STRZ (ELECTRODE) IMPLANT
CLIP APPLIE ROT 10 11.4 M/L (STAPLE) IMPLANT
CLIP APPLIE ROT 13.4 12 LRG (CLIP) IMPLANT
DERMABOND ADVANCED (GAUZE/BANDAGES/DRESSINGS) ×2
DERMABOND ADVANCED .7 DNX12 (GAUZE/BANDAGES/DRESSINGS) IMPLANT
DEVICE SUT QUICK LOAD TK 5 (STAPLE) ×2 IMPLANT
DEVICE SUT TI-KNOT TK 5X26 (MISCELLANEOUS) ×1 IMPLANT
DEVICE SUTURE ENDOST 10MM (ENDOMECHANICALS) ×2 IMPLANT
DEVICE TI KNOT TK5 (MISCELLANEOUS) ×1
DEVICE TROCAR PUNCTURE CLOSURE (ENDOMECHANICALS) ×3 IMPLANT
DISSECTOR BLUNT TIP ENDO 5MM (MISCELLANEOUS) ×3 IMPLANT
DRAPE CAMERA CLOSED 9X96 (DRAPES) ×3 IMPLANT
ELECT REM PT RETURN 9FT ADLT (ELECTROSURGICAL) ×3
ELECTRODE REM PT RTRN 9FT ADLT (ELECTROSURGICAL) ×1 IMPLANT
GAUZE SPONGE 4X4 12PLY STRL (GAUZE/BANDAGES/DRESSINGS) IMPLANT
GLOVE BIOGEL M 8.0 STRL (GLOVE) ×3 IMPLANT
GOWN STRL REUS W/TWL XL LVL3 (GOWN DISPOSABLE) ×9 IMPLANT
HANDLE STAPLE EGIA 4 XL (STAPLE) ×3 IMPLANT
HOVERMATT SINGLE USE (MISCELLANEOUS) ×3 IMPLANT
KIT BASIN OR (CUSTOM PROCEDURE TRAY) ×3 IMPLANT
NDL SPNL 22GX3.5 QUINCKE BK (NEEDLE) ×1 IMPLANT
NEEDLE SPNL 22GX3.5 QUINCKE BK (NEEDLE) ×3 IMPLANT
PACK UNIVERSAL I (CUSTOM PROCEDURE TRAY) ×3 IMPLANT
PEN SKIN MARKING BROAD (MISCELLANEOUS) ×3 IMPLANT
QUICK LOAD TK 5 (STAPLE) ×2
RELOAD ENDO STITCH (ENDOMECHANICALS) IMPLANT
RELOAD STAPLE 45 PURP MED/THCK (STAPLE) IMPLANT
RELOAD SUT TRIPLE-STITCH 2-0 (ENDOMECHANICALS) IMPLANT
RELOAD TRI 45 ART MED THCK BLK (STAPLE) ×3 IMPLANT
RELOAD TRI 45 ART MED THCK PUR (STAPLE) ×3 IMPLANT
RELOAD TRI 60 ART MED THCK BLK (STAPLE) ×3 IMPLANT
RELOAD TRI 60 ART MED THCK PUR (STAPLE) ×9 IMPLANT
SCISSORS LAP 5X45 EPIX DISP (ENDOMECHANICALS) ×2 IMPLANT
SCRUB PCMX 4 OZ (MISCELLANEOUS) ×6 IMPLANT
SEALANT SURGICAL APPL DUAL CAN (MISCELLANEOUS) IMPLANT
SET IRRIG TUBING LAPAROSCOPIC (IRRIGATION / IRRIGATOR) ×3 IMPLANT
SHEARS CURVED HARMONIC AC 45CM (MISCELLANEOUS) ×3 IMPLANT
SLEEVE ADV FIXATION 5X100MM (TROCAR) ×6 IMPLANT
SLEEVE GASTRECTOMY 36FR VISIGI (MISCELLANEOUS) ×3 IMPLANT
SOLUTION ANTI FOG 6CC (MISCELLANEOUS) ×3 IMPLANT
SPONGE LAP 18X18 X RAY DECT (DISPOSABLE) ×3 IMPLANT
STAPLER VISISTAT 35W (STAPLE) ×3 IMPLANT
SUT SURGIDAC NAB ES-9 0 48 120 (SUTURE) ×4 IMPLANT
SUT VIC AB 4-0 SH 18 (SUTURE) ×3 IMPLANT
SYR 20CC LL (SYRINGE) ×3 IMPLANT
SYR 50ML LL SCALE MARK (SYRINGE) ×3 IMPLANT
TOWEL OR 17X26 10 PK STRL BLUE (TOWEL DISPOSABLE) ×6 IMPLANT
TOWEL OR NON WOVEN STRL DISP B (DISPOSABLE) ×3 IMPLANT
TRAY FOLEY CATH 14FRSI W/METER (CATHETERS) ×3 IMPLANT
TROCAR ADV FIXATION 12X100MM (TROCAR) ×3 IMPLANT
TROCAR ADV FIXATION 5X100MM (TROCAR) ×3 IMPLANT
TROCAR BLADELESS 15MM (ENDOMECHANICALS) ×3 IMPLANT
TROCAR BLADELESS OPT 5 100 (ENDOMECHANICALS) ×3 IMPLANT
TUBE CALIBRATION LAPBAND (TUBING) ×2 IMPLANT
TUBING CONNECTING 10 (TUBING) ×2 IMPLANT
TUBING CONNECTING 10' (TUBING) ×1
TUBING ENDO SMARTCAP (MISCELLANEOUS) ×3 IMPLANT
TUBING FILTER THERMOFLATOR (ELECTROSURGICAL) ×3 IMPLANT

## 2013-10-01 NOTE — Anesthesia Preprocedure Evaluation (Addendum)
Anesthesia Evaluation  Patient identified by MRN, date of birth, ID band Patient awake    Reviewed: Allergy & Precautions, H&P , NPO status , Patient's Chart, lab work & pertinent test results  History of Anesthesia Complications (+) POST - OP SPINAL HEADACHE and history of anesthetic complications  Airway Mallampati: II TM Distance: >3 FB Neck ROM: Full    Dental no notable dental hx.    Pulmonary sleep apnea , former smoker,  breath sounds clear to auscultation  Pulmonary exam normal       Cardiovascular hypertension, Rhythm:Regular Rate:Normal     Neuro/Psych  Headaches,  Neuromuscular disease negative psych ROS   GI/Hepatic Neg liver ROS, hiatal hernia, GERD-  ,  Endo/Other  diabetes, Type 2, Oral Hypoglycemic Agents  Renal/GU negative Renal ROS  negative genitourinary   Musculoskeletal  (+) Arthritis -, Left shoulder pain   Abdominal (+) + obese,   Peds negative pediatric ROS (+)  Hematology negative hematology ROS (+)   Anesthesia Other Findings   Reproductive/Obstetrics negative OB ROS                          Anesthesia Physical Anesthesia Plan  ASA: III  Anesthesia Plan: General   Post-op Pain Management:    Induction: Intravenous  Airway Management Planned: Oral ETT  Additional Equipment:   Intra-op Plan:   Post-operative Plan: Extubation in OR  Informed Consent: I have reviewed the patients History and Physical, chart, labs and discussed the procedure including the risks, benefits and alternatives for the proposed anesthesia with the patient or authorized representative who has indicated his/her understanding and acceptance.   Dental advisory given  Plan Discussed with: CRNA  Anesthesia Plan Comments:         Anesthesia Quick Evaluation

## 2013-10-01 NOTE — Anesthesia Postprocedure Evaluation (Signed)
  Anesthesia Post-op Note  Patient: Tina Ortega  Procedure(s) Performed: Procedure(s) (LRB): LAPAROSCOPIC GASTRIC SLEEVE RESECTION WITH ENDOSCOPY, HIATAL HERNIA REPAIR  (N/A)  Patient Location: PACU  Anesthesia Type: General  Level of Consciousness: awake and alert   Airway and Oxygen Therapy: Patient Spontanous Breathing  Post-op Pain: mild  Post-op Assessment: Post-op Vital signs reviewed, Patient's Cardiovascular Status Stable, Respiratory Function Stable, Patent Airway and No signs of Nausea or vomiting  Last Vitals:  Filed Vitals:   10/01/13 1305  BP: 154/67  Pulse: 91  Temp: 36.6 C  Resp: 16    Post-op Vital Signs: stable   Complications: No apparent anesthesia complications

## 2013-10-01 NOTE — Op Note (Signed)
Surgeon: Wenda Low, MD, FACS  Asst:  Gaynelle Adu, MD, FACS  Anes:  General endotracheal  Procedure: Laparoscopic sleeve gastrectomy and upper endoscopy and posterior repair of prominent sliding hiatus hernia  Diagnosis: Morbid obesity  Complications: none  EBL:   minimal cc  Description of Procedure:  The patient was take to OR 11 and given general anesthesia.  The abdomen was prepped with PCMX and draped sterilely.  A timeout was performed.  Access to the abdomen was achieved with a 5 mm Optiview through the left upper quadrant.  Following insufflation, the state of the abdomen was found to be free of adhesions except for the lower midline at the site of her prior C sections.  The calibration tubing was first inserted and this nicely demonstrated her hiatus hernia.  A posterior dissection revealed herniated fat up in the mediastinum and the crura were approximated with 2 sutures of 0-Surgidek.   The ViSiGi 36Fr tube was inserted to deflate the stomach and was pulled back into the esophagus.    The pylorus was identified and we measured 5 cm back and marked the antrum.  At that point we began dissection to take down the greater curvature of the stomach using the Harmonic scalpel.  This dissection was taken all the way up to the left crus.  Posterior attachments of the stomach were also taken down.    The ViSiGi tube was then passed into the antrum and suction applied so that it was snug along the lessor curvature.  The "crow's foot" or incisura was identified.  The sleeve gastrectomy was begun using the [employer] stapler beginning with a 4.5 Black and then a 6 cm Black followed by 6 cm purple loads and a final 4.5 purple.  All had the buttress material built in  TRS system by [employer].  When the sleeve was complete the tube was taken off suction and insufflated briefly.  The tube was withdrawn.  Upper endoscopy was then performed by Dr. Andrey Campanile which showed no significant bleeding and  no bubbles.     The specimen was extracted through the 15 trocar site.  Wounds were infiltrated with Exparel  and closed with 4-0 Vicryl.  The 15 mm trocar in the right side was closed with the endoclose using a 0 vicryl.Susy Frizzle B. Daphine Deutscher, MD, [facility], [place] [phone]

## 2013-10-01 NOTE — Op Note (Signed)
Tina Ortega [id number] 10/08/1945 10/01/2013  Preoperative diagnosis: morbid obesity  Postoperative diagnosis: Same   Procedure: Esophagogastroduodenoscopy   Surgeon: Mary Sella. Jaythen Hamme M.D., FACS   Anesthesia: Gen.   Indications for procedure: 68 year old WF undergoing Laparoscopic Gastric Sleeve Resection and an EGD was requested to evaluate the new gastric sleeve.   Description of procedure: After we have completed the sleeve resection, I scrubbed out and obtained the Olympus endoscope. I gently placed endoscope in the patient's oropharynx and gently glided it down the esophagus without any difficulty under direct visualization. Once I was in the gastric sleeve, I insufflated the stomach with air. I was able to cannulate and advanced the scope through the gastric sleeve. I was able to cannulate the duodenum with ease. Dr. Daphine Deutscher had placed saline in the upper abdomen. Upon further insufflation of the gastric sleeve there was no evidence of bubbles. GE junction located at 36 cm.  Upon further inspection of the gastric sleeve, the mucosa appeared normal. There is no evidence of any mucosal abnormality. The sleeve was widely patent at the angularis. There was no evidence of bleeding. The gastric sleeve was decompressed. The scope was withdrawn. The patient tolerated this portion of the procedure well. Please see Dr Ermalene Searing operative note for details regarding the laparoscopic gastric sleeve resection.   Mary Sella. Andrey Campanile, MD, FACS  General, Bariatric, & Minimally Invasive Surgery  [facility], [place]

## 2013-10-01 NOTE — Interval H&P Note (Signed)
History and Physical Interval Note:  10/01/2013 8:59 AM  Tina Ortega  has presented today for surgery, with the diagnosis of Morbid Obesity  The various methods of treatment have been discussed with the patient and family. After consideration of risks, benefits and other options for treatment, the patient has consented to  Procedure(s): LAPAROSCOPIC GASTRIC SLEEVE RESECTION (N/A) as a surgical intervention .  The patient's history has been reviewed, patient examined, no change in status, stable for surgery.  I have reviewed the patient's chart and labs.  Questions were answered to the patient's satisfaction.     Diania Co B

## 2013-10-01 NOTE — H&P (View-Only) (Signed)
Chief Complaint: Morbid obesity BMI 41.5  History of Present Illness: Tina Ortega is an 68 y.o. female who has worked 2 jobs for over 30 years to raise her family by herself. She has DM and is followed by Dr. Imaron Haque in [place]. She has been to our seminar and wants to proceed with a bariatric intervention. We discussed the options and she would like to pursue sleeve gastrectomy to assist with management of her DM. Workup showed small sliding hiatus hernia on UGI.  Informed consent obtained.   completed.    Past Medical History   Diagnosis  Date   .  GERD (gastroesophageal reflux disease)    .  Hypertension    .  Hyperlipidemia    .  Diabetes mellitus without complication     Past Surgical History   Procedure  Laterality  Date   .  Ovarian cyst surgery   1965   .  Appendectomy   1965   .  Cesarean section   1975, 1977    Current Outpatient Prescriptions   Medication  Sig  Dispense  Refill   .  DULoxetine (CYMBALTA) 60 MG capsule      .  meloxicam (MOBIC) 15 MG tablet      .  NEXIUM 40 MG capsule      .  ONGLYZA 5 MG TABS tablet       No current facility-administered medications for this visit.   Penicillins  Family History   Problem  Relation  Age of Onset   .  Diabetes  Mother    .  Heart disease  Mother    .  Arthritis  Father    .  Arthritis  Sister    Social History: reports that she quit smoking about 28 years ago. She does not have any smokeless tobacco history on file. She reports that she does not use illicit drugs. Her alcohol history is not on file.  REVIEW OF SYSTEMS - PERTINENT POSITIVES ONLY:  No history of DVT; 2 c sections Positive for night sweats, blood pressure, kidney stones, rectal bleeding, reflux hoarseness and sore throat, glasses, otherwise negative  Physical Exam:  Blood pressure 142/80, pulse 80, temperature 97.8 F (36.6 C), temperature source Temporal, resp. rate 18, height 5' (1.524 m), weight 211 lb 6.4 oz (95.89 kg).  Body mass index is  41.29 kg/(m^2).  Gen: WDWN WF NAD  Neurological: Alert and oriented to person, place, and time. Motor and sensory function is grossly intact  Head: Normocephalic and atraumatic.  Eyes: Conjunctivae are normal. Pupils are equal, round, and reactive to light. No scleral icterus.  Neck: Normal range of motion. Neck supple. No tracheal deviation or thyromegaly present.  Cardiovascular: SR without murmurs or gallops. No carotid bruits  Respiratory: Effort normal. No respiratory distress. No chest wall tenderness. Breath sounds normal. No wheezes, rales or rhonchi.  Abdomen: Obese-centripedal  GU:  Musculoskeletal: Normal range of motion. Extremities are nontender. No cyanosis, edema or clubbing noted Lymphadenopathy: No cervical, preauricular, postauricular or axillary adenopathy is present Skin: Skin is warm and dry. No rash noted. No diaphoresis. No erythema. No pallor. Pscyh: Normal mood and affect. Behavior is normal. Judgment and thought content normal.  LABORATORY RESULTS:  No results found for this or any previous visit (from the past 48 hour(s)).  RADIOLOGY RESULTS:  No results found.  Problem List:  Patient Active Problem List    Diagnosis  Date Noted   .  Diabetes  05/22/2013   .    GERD (gastroesophageal reflux disease)  05/22/2013   .  Other and unspecified hyperlipidemia  05/22/2013   .  Essential hypertension, benign  05/22/2013   Assessment & Plan:  Younger and physiologically in better shape that age. Ready for sleeve gastrectomy.  UGI showed small sliding hiatua hernia.  I have discussed the procedure again with her in detail.  She is ready for surgery next week.  Matt B. Daphine Deutscher, MD, [facility]  [phone] beeper  [phone]

## 2013-10-01 NOTE — Transfer of Care (Signed)
Immediate Anesthesia Transfer of Care Note  Patient: Tina Ortega  Procedure(s) Performed: Procedure(s): LAPAROSCOPIC GASTRIC SLEEVE RESECTION WITH ENDOSCOPY  (N/A)  Patient Location: PACU  Anesthesia Type:General  Level of Consciousness: awake, sedated and patient cooperative  Airway & Oxygen Therapy: Patient Spontanous Breathing and Patient connected to T-piece oxygen  Post-op Assessment: Report given to PACU RN and Post -op Vital signs reviewed and stable  Post vital signs: Reviewed and stable  Complications: No apparent anesthesia complications

## 2013-10-02 ENCOUNTER — Encounter (HOSPITAL_COMMUNITY): Payer: Self-pay | Admitting: Surgery

## 2013-10-02 ENCOUNTER — Inpatient Hospital Stay (HOSPITAL_COMMUNITY): Payer: 59

## 2013-10-02 DIAGNOSIS — Z09 Encounter for follow-up examination after completed treatment for conditions other than malignant neoplasm: Secondary | ICD-10-CM

## 2013-10-02 LAB — HEMOGLOBIN AND HEMATOCRIT, BLOOD
HEMATOCRIT: 41.1 % (ref 36.0–46.0)
HEMOGLOBIN: 13.1 g/dL (ref 12.0–15.0)

## 2013-10-02 LAB — CBC WITH DIFFERENTIAL/PLATELET
BASOS PCT: 0 % (ref 0–1)
Basophils Absolute: 0 10*3/uL (ref 0.0–0.1)
Eosinophils Absolute: 0 10*3/uL (ref 0.0–0.7)
Eosinophils Relative: 0 % (ref 0–5)
HCT: 41.1 % (ref 36.0–46.0)
HEMOGLOBIN: 13.2 g/dL (ref 12.0–15.0)
Lymphocytes Relative: 12 % (ref 12–46)
Lymphs Abs: 1.5 10*3/uL (ref 0.7–4.0)
MCH: 28.9 pg (ref 26.0–34.0)
MCHC: 32.1 g/dL (ref 30.0–36.0)
MCV: 90.1 fL (ref 78.0–100.0)
Monocytes Absolute: 1 10*3/uL (ref 0.1–1.0)
Monocytes Relative: 8 % (ref 3–12)
NEUTROS ABS: 9.6 10*3/uL — AB (ref 1.7–7.7)
NEUTROS PCT: 80 % — AB (ref 43–77)
Platelets: 237 10*3/uL (ref 150–400)
RBC: 4.56 MIL/uL (ref 3.87–5.11)
RDW: 12.8 % (ref 11.5–15.5)
WBC: 12.1 10*3/uL — ABNORMAL HIGH (ref 4.0–10.5)

## 2013-10-02 LAB — GLUCOSE, CAPILLARY
Glucose-Capillary: 128 mg/dL — ABNORMAL HIGH (ref 70–99)
Glucose-Capillary: 123 mg/dL — ABNORMAL HIGH (ref 70–99)
Glucose-Capillary: 131 mg/dL — ABNORMAL HIGH (ref 70–99)
Glucose-Capillary: 125 mg/dL — ABNORMAL HIGH (ref 70–99)
Glucose-Capillary: 112 mg/dL — ABNORMAL HIGH (ref 70–99)

## 2013-10-02 MED ORDER — IOHEXOL 300 MG/ML  SOLN
50.0000 mL | Freq: Once | INTRAMUSCULAR | Status: AC | PRN
  Administered 2013-10-02: 50 mL via ORAL

## 2013-10-02 NOTE — Progress Notes (Signed)
Utilization review completed.  

## 2013-10-02 NOTE — Progress Notes (Signed)
Patient ID: Tina Ortega, female   DOB: 1945/10/14, 68 y.o.   MRN: [id number] 1 Day Post-Op  Subjective: No complaints this morning, just "sore". No nausea. Has been ambulatory.  Objective: Vital signs in last 24 hours: Temp:  [97.4 F (36.3 C)-98.6 F (37 C)] 98.3 F (36.8 C) (09/09 0452) Pulse Rate:  [88-107] 106 (09/09 0452) Resp:  [14-26] 22 (09/09 0452) BP: (119-156)/(51-72) 121/57 mmHg (09/09 0452) SpO2:  [91 %-100 %] 93 % (09/09 0452) Last BM Date: 09/30/13  Intake/Output from previous day: 09/08 0701 - 09/09 0700 In: 4151.7 [I.V.:4151.7] Out: 1550 [Urine:1550] Intake/Output this shift:    General appearance: alert, cooperative and no distress Resp: no wheezing or increased work of breathing GI: normal findings: soft, non-tender Incision/Wound: clean and dry  Lab Results:   Recent Labs  10/01/13 1337 10/02/13 0440  WBC 12.7* 12.1*  HGB 13.6 13.2  HCT 41.4 41.1  PLT 257 237   BMET  Recent Labs  10/01/13 1337  CREATININE 0.58   CBG (last 3)   Recent Labs  10/01/13 1939 10/01/13 2338 10/02/13 0338  GLUCAP 150* 129* 128*      Studies/Results: No results found.  Anti-infectives: Anti-infectives   Start     Dose/Rate Route Frequency Ordered Stop   10/01/13 0715  levofloxacin (LEVAQUIN) IVPB 750 mg     750 mg 100 mL/hr over 90 Minutes Intravenous On call to O.R. 10/01/13 4098 10/01/13 0938      Assessment/Plan: s/p Procedure(s): LAPAROSCOPIC GASTRIC SLEEVE RESECTION WITH ENDOSCOPY, HIATAL HERNIA REPAIR  Doing well postoperatively without apparent complication. For Gastrografin swallow this morning.   LOS: 1 day    Tina Ortega T 10/02/2013

## 2013-10-02 NOTE — Progress Notes (Signed)
Nutrition Education Note  Received consult for diet education per DROP protocol.   Discussed 2 week post op diet with pt. Emphasized that liquids must be non carbonated, non caffeinated, and sugar free. Fluid goals discussed. Pt to follow up with outpatient bariatric RD for further diet progression after 2 weeks. Multivitamins and minerals also reviewed. Teach back method used, pt expressed understanding, expect good compliance. Pt denies any nausea but does c/o queasiness. Hopeful to get in a nap this morning.    Diet: First 2 Weeks  You will see the nutritionist about two (2) weeks after your surgery. The nutritionist will increase the types of foods you can eat if you are handling liquids well:  If you have severe vomiting or nausea and cannot handle clear liquids lasting longer than 1 day, call your surgeon  Protein Shake  Drink at least 2 ounces of shake 5-6 times per day  Each serving of protein shakes (usually 8 - 12 ounces) should have a minimum of:  15 grams of protein  And no more than 5 grams of carbohydrate  Goal for protein each day:  Men = 80 grams per day  Women = 60 grams per day  Protein powder may be added to fluids such as non-fat milk or Lactaid milk or Soy milk (limit to 35 grams added protein powder per serving)   Hydration  Slowly increase the amount of water and other clear liquids as tolerated (See Acceptable Fluids)  Slowly increase the amount of protein shake as tolerated  Sip fluids slowly and throughout the day  May use sugar substitutes in small amounts (no more than 6 - 8 packets per day; i.e. Splenda)   Fluid Goal  The first goal is to drink at least 8 ounces of protein shake/drink per day (or as directed by the nutritionist); some examples of protein shakes are [employer], [employer]illard's, EAS Edge HP, and Unjury. See handout from pre-op Bariatric Education Class:  Slowly increase the amount of protein shake you drink as tolerated  You may find it  easier to slowly sip shakes throughout the day  It is important to get your proteins in first  Your fluid goal is to drink 64 - 100 ounces of fluid daily  It may take a few weeks to build up to this  32 oz (or more) should be clear liquids  And  32 oz (or more) should be full liquids (see below for examples)  Liquids should not contain sugar, caffeine, or carbonation   Clear Liquids:  Water or Sugar-free flavored water (i.e. Fruit H2O, Propel)  Decaffeinated coffee or tea (sugar-free)  Crystal Lite, Wyler's Lite, Minute Maid Lite  Sugar-free Jell-O  Bouillon or broth  Sugar-free Popsicle: *Less than 20 calories each; Limit 1 per day   Full Liquids:  Protein Shakes/Drinks + 2 choices per day of other full liquids  Full liquids must be:  No More Than 12 grams of Carbs per serving  No More Than 3 grams of Fat per serving  Strained low-fat cream soup  Non-Fat milk  Fat-free Lactaid Milk  Sugar-free yogurt (Dannon Lite & Fit, Greek yogurt)     [place] MS, RD, [place] [phone] Pager [phone] Weekend/After Hours Pager

## 2013-10-02 NOTE — Progress Notes (Signed)
Patient alert and oriented, Post op day 1.  Provided support and encouragement.  Encouraged pulmonary toilet, ambulation and small sips of liquids when swallow study returned satisfactorily.  All questions answered.  Will continue to monitor. 

## 2013-10-02 NOTE — Progress Notes (Signed)
VASCULAR LAB PRELIMINARY  PRELIMINARY  PRELIMINARY  PRELIMINARY  Bilateral lower extremity venous duplex completed.    Preliminary report:  Bilateral:  No evidence of DVT, superficial thrombosis, or Baker's Cyst.   Tina Ortega, RVS 10/02/2013, 8:58 AM

## 2013-10-03 LAB — CBC WITH DIFFERENTIAL/PLATELET
BASOS ABS: 0 10*3/uL (ref 0.0–0.1)
Basophils Relative: 0 % (ref 0–1)
EOS ABS: 0.1 10*3/uL (ref 0.0–0.7)
Eosinophils Relative: 1 % (ref 0–5)
HEMATOCRIT: 42.2 % (ref 36.0–46.0)
Hemoglobin: 13.4 g/dL (ref 12.0–15.0)
LYMPHS ABS: 1.5 10*3/uL (ref 0.7–4.0)
Lymphocytes Relative: 17 % (ref 12–46)
MCH: 29.3 pg (ref 26.0–34.0)
MCHC: 31.8 g/dL (ref 30.0–36.0)
MCV: 92.1 fL (ref 78.0–100.0)
MONO ABS: 1.1 10*3/uL — AB (ref 0.1–1.0)
Monocytes Relative: 12 % (ref 3–12)
Neutro Abs: 6.4 10*3/uL (ref 1.7–7.7)
Neutrophils Relative %: 70 % (ref 43–77)
PLATELETS: 207 10*3/uL (ref 150–400)
RBC: 4.58 MIL/uL (ref 3.87–5.11)
RDW: 12.8 % (ref 11.5–15.5)
WBC: 9.1 10*3/uL (ref 4.0–10.5)

## 2013-10-03 LAB — GLUCOSE, CAPILLARY
GLUCOSE-CAPILLARY: 111 mg/dL — AB (ref 70–99)
GLUCOSE-CAPILLARY: 120 mg/dL — AB (ref 70–99)
GLUCOSE-CAPILLARY: 121 mg/dL — AB (ref 70–99)
GLUCOSE-CAPILLARY: 130 mg/dL — AB (ref 70–99)
Glucose-Capillary: 109 mg/dL — ABNORMAL HIGH (ref 70–99)
Glucose-Capillary: 113 mg/dL — ABNORMAL HIGH (ref 70–99)
Glucose-Capillary: 138 mg/dL — ABNORMAL HIGH (ref 70–99)

## 2013-10-03 NOTE — Progress Notes (Signed)
Patient ambulating in hallways, at times will drop oxygen saturation to high-mid 80s, patient in no distress and states she does not feel short of breath.  Able to walk a complete lap in hallway. Discussed occurrence with Skip Estimable RN.  Will continue to monitor patient

## 2013-10-03 NOTE — Discharge Instructions (Signed)

## 2013-10-03 NOTE — Progress Notes (Signed)
Patient alert and oriented, pain is controlled. Patient is tolerating fluids,  advanced to protein shake today, patient tolerated well.  Reviewed Gastric sleeve discharge instructions with patient and patient is able to articulate understanding.  Provided information on BELT program, Support Group and [facility] outpatient pharmacy. All questions answered, will continue to monitor.  

## 2013-10-03 NOTE — Progress Notes (Signed)
Patient ID: Tina Ortega, female   DOB: [birth year], 68 y.o.   MRN: [id number] [facility] Progress Note:   2 Days Post-Op  Subjective: Mental status is clear Objective: Vital signs in last 24 hours: Temp:  [97.5 F (36.4 C)-99.9 F (37.7 C)] 99.6 F (37.6 C) (09/10 0615) Pulse Rate:  [93-106] 93 (09/10 0615) Resp:  [20-23] 23 (09/10 0615) BP: (108-130)/(46-61) 112/46 mmHg (09/10 0615) SpO2:  [93 %-99 %] 96 % (09/10 0615)  Intake/Output from previous day: 09/09 0701 - 09/10 0700 In: 1786.7 [P.O.:240; I.V.:1546.7] Out: 3700 [Urine:3700] Intake/Output this shift:    Physical Exam: Work of breathing is normal.   Lab Results:  Results for orders placed during the hospital encounter of 10/01/13 (from the past 48 hour(s))  GLUCOSE, CAPILLARY     Status: Abnormal   Collection Time    10/01/13 11:54 AM      Result Value Ref Range   Glucose-Capillary 155 (*) 70 - 99 mg/dL  GLUCOSE, CAPILLARY     Status: Abnormal   Collection Time    10/01/13  1:22 PM      Result Value Ref Range   Glucose-Capillary 140 (*) 70 - 99 mg/dL  HEMOGLOBIN A1C     Status: Abnormal   Collection Time    10/01/13  1:37 PM      Result Value Ref Range   Hemoglobin A1C 6.8 (*) <5.7 %   Comment: (NOTE)                                                                               According to the ADA Clinical Practice Recommendations for 2011, when     HbA1c is used as a screening test:      >=6.5%   Diagnostic of Diabetes Mellitus               (if abnormal result is confirmed)     5.7-6.4%   Increased risk of developing Diabetes Mellitus     References:Diagnosis and Classification of Diabetes Mellitus,Diabetes     Care,2011,34(Suppl 1):S62-S69 and Standards of Medical Care in             Diabetes - 2011,Diabetes Care,2011,34 (Suppl 1):S11-S61.   Mean Plasma Glucose 148 (*) <117 mg/dL   Comment: Performed at [facility]  CBC     Status: Abnormal   Collection Time    10/01/13   1:37 PM      Result Value Ref Range   WBC 12.7 (*) 4.0 - 10.5 K/uL   RBC 4.68  3.87 - 5.11 MIL/uL   Hemoglobin 13.6  12.0 - 15.0 g/dL   HCT 41.4  36.0 - 46.0 %   MCV 88.5  78.0 - 100.0 fL   MCH 29.1  26.0 - 34.0 pg   MCHC 32.9  30.0 - 36.0 g/dL   RDW 12.7  11.5 - 15.5 %   Platelets 257  150 - 400 K/uL  CREATININE, SERUM     Status: None   Collection Time    10/01/13  1:37 PM      Result Value Ref Range   Creatinine, Ser 0.58  0.50 - 1.10 mg/dL   GFR  calc non Af Amer >90  >90 mL/min   GFR calc Af Amer >90  >90 mL/min   Comment: (NOTE)     The eGFR has been calculated using the CKD EPI equation.     This calculation has not been validated in all clinical situations.     eGFR's persistently <90 mL/min signify possible Chronic Kidney     Disease.  GLUCOSE, CAPILLARY     Status: Abnormal   Collection Time    10/01/13  4:08 PM      Result Value Ref Range   Glucose-Capillary 136 (*) 70 - 99 mg/dL  GLUCOSE, CAPILLARY     Status: Abnormal   Collection Time    10/01/13  7:39 PM      Result Value Ref Range   Glucose-Capillary 150 (*) 70 - 99 mg/dL  GLUCOSE, CAPILLARY     Status: Abnormal   Collection Time    10/01/13 11:38 PM      Result Value Ref Range   Glucose-Capillary 129 (*) 70 - 99 mg/dL  GLUCOSE, CAPILLARY     Status: Abnormal   Collection Time    10/02/13  3:38 AM      Result Value Ref Range   Glucose-Capillary 128 (*) 70 - 99 mg/dL  CBC WITH DIFFERENTIAL     Status: Abnormal   Collection Time    10/02/13  4:40 AM      Result Value Ref Range   WBC 12.1 (*) 4.0 - 10.5 K/uL   RBC 4.56  3.87 - 5.11 MIL/uL   Hemoglobin 13.2  12.0 - 15.0 g/dL   HCT 41.1  36.0 - 46.0 %   MCV 90.1  78.0 - 100.0 fL   MCH 28.9  26.0 - 34.0 pg   MCHC 32.1  30.0 - 36.0 g/dL   RDW 12.8  11.5 - 15.5 %   Platelets 237  150 - 400 K/uL   Neutrophils Relative % 80 (*) 43 - 77 %   Neutro Abs 9.6 (*) 1.7 - 7.7 K/uL   Lymphocytes Relative 12  12 - 46 %   Lymphs Abs 1.5  0.7 - 4.0 K/uL    Monocytes Relative 8  3 - 12 %   Monocytes Absolute 1.0  0.1 - 1.0 K/uL   Eosinophils Relative 0  0 - 5 %   Eosinophils Absolute 0.0  0.0 - 0.7 K/uL   Basophils Relative 0  0 - 1 %   Basophils Absolute 0.0  0.0 - 0.1 K/uL  GLUCOSE, CAPILLARY     Status: Abnormal   Collection Time    10/02/13  8:12 AM      Result Value Ref Range   Glucose-Capillary 123 (*) 70 - 99 mg/dL  GLUCOSE, CAPILLARY     Status: Abnormal   Collection Time    10/02/13 11:48 AM      Result Value Ref Range   Glucose-Capillary 131 (*) 70 - 99 mg/dL  HEMOGLOBIN AND HEMATOCRIT, BLOOD     Status: None   Collection Time    10/02/13  3:56 PM      Result Value Ref Range   Hemoglobin 13.1  12.0 - 15.0 g/dL   HCT 41.1  36.0 - 46.0 %  GLUCOSE, CAPILLARY     Status: Abnormal   Collection Time    10/02/13  4:20 PM      Result Value Ref Range   Glucose-Capillary 125 (*) 70 - 99 mg/dL  GLUCOSE, CAPILLARY  Status: Abnormal   Collection Time    10/02/13  7:50 PM      Result Value Ref Range   Glucose-Capillary 112 (*) 70 - 99 mg/dL  GLUCOSE, CAPILLARY     Status: Abnormal   Collection Time    10/03/13 12:04 AM      Result Value Ref Range   Glucose-Capillary 138 (*) 70 - 99 mg/dL  GLUCOSE, CAPILLARY     Status: Abnormal   Collection Time    10/03/13  4:05 AM      Result Value Ref Range   Glucose-Capillary 121 (*) 70 - 99 mg/dL  CBC WITH DIFFERENTIAL     Status: Abnormal   Collection Time    10/03/13  5:05 AM      Result Value Ref Range   WBC 9.1  4.0 - 10.5 K/uL   Comment: WHITE COUNT CONFIRMED ON SMEAR   RBC 4.58  3.87 - 5.11 MIL/uL   Hemoglobin 13.4  12.0 - 15.0 g/dL   HCT 42.2  36.0 - 46.0 %   MCV 92.1  78.0 - 100.0 fL   MCH 29.3  26.0 - 34.0 pg   MCHC 31.8  30.0 - 36.0 g/dL   RDW 12.8  11.5 - 15.5 %   Platelets 207  150 - 400 K/uL   Neutrophils Relative % 70  43 - 77 %   Lymphocytes Relative 17  12 - 46 %   Monocytes Relative 12  3 - 12 %   Eosinophils Relative 1  0 - 5 %   Basophils Relative 0  0 -  1 %   Neutro Abs 6.4  1.7 - 7.7 K/uL   Lymphs Abs 1.5  0.7 - 4.0 K/uL   Monocytes Absolute 1.1 (*) 0.1 - 1.0 K/uL   Eosinophils Absolute 0.1  0.0 - 0.7 K/uL   Basophils Absolute 0.0  0.0 - 0.1 K/uL   Smear Review MORPHOLOGY UNREMARKABLE    GLUCOSE, CAPILLARY     Status: Abnormal   Collection Time    10/03/13  7:19 AM      Result Value Ref Range   Glucose-Capillary 113 (*) 70 - 99 mg/dL    Radiology/Results: Dg Ugi W/water Sol Cm  10/02/2013   CLINICAL DATA:  Postop gastric sleeve and hiatal hernia repair.  EXAM: WATER SOLUBLE UPPER GI SERIES  TECHNIQUE: Single-column upper GI series was performed using water soluble contrast.  CONTRAST:  65mL OMNIPAQUE IOHEXOL 300 MG/ML  SOLN  COMPARISON:  Upper GI 06/24/2013  FLUOROSCOPY TIME:  1 min 1 second  FINDINGS: Scout radiograph of the abdomen demonstrates suture material in the left upper quadrant. Gas and stool are present in grossly nondilated colon, incompletely visualized. No dilated small bowel loops are seen in the visualized portion of the abdomen. Minimal atelectasis is present in the lung bases.  The patient drank contrast without difficulty. Contrast passed readily from the esophagus into the stomach and subsequently into small bowel without delay. No contrast extravasation was seen to indicate leak.  IMPRESSION: No evidence of gastric obstruction or leak following sleeve gastrectomy.   Electronically Signed   By: Logan Bores   On: 10/02/2013 10:00    Anti-infectives: Anti-infectives   Start     Dose/Rate Route Frequency Ordered Stop   10/01/13 0715  levofloxacin (LEVAQUIN) IVPB 750 mg     750 mg 100 mL/hr over 90 Minutes Intravenous On call to O.R. 10/01/13 0658 10/01/13 0938      Assessment/Plan: Problem List:  Patient Active Problem List   Diagnosis Date Noted  . Lap sleeve gastrectomy and repair [facility] Sept 2015 10/01/2013  . S/P laparoscopic sleeve gastrectomy 10/01/2013  . OSA (obstructive sleep apnea) 07/12/2013  . Diabetes  05/22/2013  . GERD (gastroesophageal reflux disease) 05/22/2013  . Other and unspecified hyperlipidemia 05/22/2013  . Essential hypertension, benign 05/22/2013    Doing well but weak.  Lives at home alone.  Encouraged to get up and walk around a lot.  Hopeful discharge tomorrow or later today.   2 Days Post-Op    LOS: 2 days   Matt B. Hassell Done, MD, [facility] [phone] beeper [phone]  10/03/2013 7:51 AM

## 2013-10-04 LAB — CBC WITH DIFFERENTIAL/PLATELET
BASOS ABS: 0 10*3/uL (ref 0.0–0.1)
Basophils Relative: 0 % (ref 0–1)
EOS PCT: 3 % (ref 0–5)
Eosinophils Absolute: 0.2 10*3/uL (ref 0.0–0.7)
HCT: 40.8 % (ref 36.0–46.0)
Hemoglobin: 13.3 g/dL (ref 12.0–15.0)
LYMPHS PCT: 27 % (ref 12–46)
Lymphs Abs: 2.1 10*3/uL (ref 0.7–4.0)
MCH: 29.5 pg (ref 26.0–34.0)
MCHC: 32.6 g/dL (ref 30.0–36.0)
MCV: 90.5 fL (ref 78.0–100.0)
Monocytes Absolute: 0.7 10*3/uL (ref 0.1–1.0)
Monocytes Relative: 9 % (ref 3–12)
NEUTROS PCT: 61 % (ref 43–77)
Neutro Abs: 4.7 10*3/uL (ref 1.7–7.7)
PLATELETS: 205 10*3/uL (ref 150–400)
RBC: 4.51 MIL/uL (ref 3.87–5.11)
RDW: 12.6 % (ref 11.5–15.5)
WBC: 7.7 10*3/uL (ref 4.0–10.5)

## 2013-10-04 LAB — GLUCOSE, CAPILLARY
GLUCOSE-CAPILLARY: 129 mg/dL — AB (ref 70–99)
GLUCOSE-CAPILLARY: 115 mg/dL — AB (ref 70–99)
Glucose-Capillary: 132 mg/dL — ABNORMAL HIGH (ref 70–99)
Glucose-Capillary: 101 mg/dL — ABNORMAL HIGH (ref 70–99)

## 2013-10-04 MED ORDER — HYDROCODONE-ACETAMINOPHEN 7.5-325 MG/15ML PO SOLN: 15.0000 mL | ORAL | Status: DC | PRN

## 2013-10-04 MED ORDER — OXYCODONE HCL 5 MG/5ML PO SOLN: 5.0000 mg | ORAL | Status: DC | PRN

## 2013-10-04 NOTE — Discharge Summary (Signed)
Physician Discharge Summary  Patient ID: Tina Ortega MRN: [id number] DOB/AGE: 1945/02/24 68 y.o.  Admit date: 10/01/2013 Discharge date: 10/04/2013  Admission Diagnoses:  Morbid obesity  Discharge Diagnoses:  same  Active Problems:   Lap sleeve gastrectomy and repair [facility] Sept 2015   Surgery:  Lap sleeve gastrectomy and hiatal hernia repair  Discharged Condition: improved  Hospital Course:   Had surgery.  PD 1 swallow looked good.  Took liquids slowly.  Ready for discharge on PD 3  Consults: none  Significant Diagnostic Studies: UGI    Discharge Exam: Blood pressure 132/62, pulse 89, temperature 98.6 F (37 C), temperature source Oral, resp. rate 21, height 5' (1.524 m), weight 199 lb (90.266 kg), SpO2 94.00%. Incisions looked ok.   Disposition: Final discharge disposition not confirmed  Discharge Instructions   Diet - low sodium heart healthy    Complete by:  As directed      Discharge instructions    Complete by:  As directed   Follow bariatric guidelines regarding your diet     Increase activity slowly    Complete by:  As directed      No wound care    Complete by:  As directed             Medication List         DULoxetine 60 MG capsule  Commonly known as:  CYMBALTA  Take 60 mg by mouth every morning.     HYDROcodone-acetaminophen 7.5-325 mg/15 ml solution  Commonly known as:  HYCET  Take 15 mLs by mouth every 4 (four) hours as needed for moderate pain.     NEXIUM 40 MG capsule  Generic drug:  esomeprazole  Take 40 mg by mouth daily.     ONGLYZA 5 MG Tabs tablet  Generic drug:  saxagliptin HCl  Take 5 mg by mouth daily.           Follow-up Information   Follow up with Valarie Merino, MD.   Specialty:  General Surgery   Contact information:   [address] [place] [address] [phone]       Signed: Valarie Merino 10/04/2013, 3:55 PM

## 2013-10-04 NOTE — Discharge Summary (Signed)
Reviewed discharge instructions with pt and spouse including follow-up meeting, precautions, and diet instructions.  Pt and spouse verbalized understanding.  Pt said she did not plan to attend scheduled bariatric meeting and was advised to contact MD and/or Bariatric Coordinator Jacki Cones to discuss so that she would not miss important follow-up.  Pt said she planned to contact MD about her return to work prior to her scheduled follow-up meeting.  (MD phone number highlighted in d/c instructions.) Pt d/c with spouse via w/c.

## 2013-10-04 NOTE — Progress Notes (Signed)
Patient oxygen levels checked when ambulating 89-90% on room air, sitting 92%room air, and while in bed 94% on room air.  Patient  reports no shortness of breath.

## 2013-10-07 ENCOUNTER — Telehealth (HOSPITAL_COMMUNITY): Payer: Self-pay

## 2013-10-07 NOTE — Telephone Encounter (Signed)

## 2013-10-15 ENCOUNTER — Encounter: Payer: 59 | Attending: Surgery

## 2013-10-15 VITALS — Ht 61.0 in | Wt 184.0 lb

## 2013-10-15 DIAGNOSIS — Z713 Dietary counseling and surveillance: Secondary | ICD-10-CM | POA: Diagnosis not present

## 2013-10-15 DIAGNOSIS — E669 Obesity, unspecified: Secondary | ICD-10-CM

## 2013-10-15 DIAGNOSIS — Z01818 Encounter for other preprocedural examination: Secondary | ICD-10-CM | POA: Diagnosis not present

## 2013-10-15 NOTE — Patient Instructions (Signed)
Patient to follow Phase 3A-Soft, High Protein Diet and follow-up at [facility] in 6 weeks for 2 months post-op nutrition visit for diet advancement. 

## 2013-10-15 NOTE — Progress Notes (Signed)
Bariatric Class:  Appt start time: 1530 end time:  1630.  2 Week Post-Operative Nutrition Class  Patient was seen on 10/15/2013 for Post-Operative Nutrition [facility].   Surgery date: 10/01/13 Surgery type: Gastric sleeve Start weight at [facility]: 213 lbs on 06/19/13 Weight today: 184.0 lbs Weight change: 25 lbs  TANITA  BODY COMP RESULTS  10/15/2013   BMI (kg/m^2) 34.8   Fat Mass (lbs) 83.0   Fat Free Mass (lbs) 101.0   Total Body Water (lbs) 74.0    The following the learning objectives were met by the patient during this course:  Identifies Phase 3A (Soft, High Proteins) Dietary Goals and will begin from 2 weeks post-operatively to 2 months post-operatively  Identifies appropriate sources of fluids and proteins   States protein recommendations and appropriate sources post-operatively  Identifies the need for appropriate texture modifications, mastication, and bite sizes when consuming solids  Identifies appropriate multivitamin and calcium sources post-operatively  Describes the need for physical activity post-operatively and will follow MD recommendations  States when to call healthcare provider regarding medication questions or post-operative complications  Handouts given during class include:  Phase 3A: Soft, High Protein Diet Handout  Follow-Up Plan: Patient will follow-up at [facility] in 6 weeks for 2 month post-op nutrition visit for diet advancement per MD.

## 2013-10-24 ENCOUNTER — Ambulatory Visit (INDEPENDENT_AMBULATORY_CARE_PROVIDER_SITE_OTHER): Payer: 59 | Admitting: Surgery

## 2013-11-01 ENCOUNTER — Ambulatory Visit (INDEPENDENT_AMBULATORY_CARE_PROVIDER_SITE_OTHER): Payer: 59 | Admitting: Surgery

## 2013-11-18 ENCOUNTER — Encounter: Payer: 59 | Attending: Surgery | Admitting: Dietician

## 2013-11-18 VITALS — Ht 61.0 in | Wt 178.0 lb

## 2013-11-18 DIAGNOSIS — Z713 Dietary counseling and surveillance: Secondary | ICD-10-CM | POA: Diagnosis not present

## 2013-11-18 DIAGNOSIS — Z6833 Body mass index (BMI) 33.0-33.9, adult: Secondary | ICD-10-CM | POA: Insufficient documentation

## 2013-11-18 DIAGNOSIS — E669 Obesity, unspecified: Secondary | ICD-10-CM | POA: Diagnosis not present

## 2013-11-18 NOTE — Patient Instructions (Addendum)
Goals:  Follow Phase 3B: High Protein + Non-Starchy Vegetables  Eat 3-6 small meals/snacks, every 3-5 hrs  Increase lean protein foods to meet 60g goal  Increase fluid intake to 64oz +  Avoid drinking 15 minutes before, during and 30 minutes after eating  Aim for >30 min of physical activity daily (think about strength training)   Start taking multivitamins, calcium citrate, and B12  Try having a pickle instead of pretzels after a protein shakes  Think about splitting up the protein shake between morning and afternoon.  Aim to eat 2-3 oz of protein at meals and non starchy vegetable (chicken or fish or steak)   Think about joining [employer] program (call/email me if you need the contact info)

## 2013-11-18 NOTE — Progress Notes (Signed)
  Follow-up visit:  7 Weeks Post-Operative Sleeve Gastrectomy Surgery  Medical Nutrition Therapy:  Appt start time: 1210 end time:  1240.  Primary concerns today: Post-operative Bariatric Surgery Nutrition Management. Returns with a  6 lbs weight loss and no issues overall. Concerned that she is not losing weight fast enough. Likely not getting in enough protein and having too many carbs. Not taking vitamin supplements.  Surgery date: 10/01/13  Surgery type: Gastric sleeve  [facility]: 213 lbs on 06/19/13  Weight today: 178 lbs   Weight change: 6 lbs lbs  Total weight loss: 35 lbs Weight loss goal: 113 lbs  TANITA BODY COMP RESULTS   10/15/2013  11/18/13  BMI (kg/m^2)  34.8  33.6  Fat Mass (lbs)  83.0  78.5  Fat Free Mass (lbs)  101.0  99.5  Total Body Water (lbs)  74.0  73.0    Preferred Learning Style:   No preference indicated   Learning Readiness:   Ready  24-hr recall: B (AM):boiled egg and 1.5 strips of bacon (8 g) Snk (AM): Premier protein shake (30 g) sometimes with honey mustard pretzels (palmful) L (PM): 1/2 cup soup with meat, instant potatoes, beans, corn, tomatoes (8 g) Snk (PM):crystal light D (PM): 1/2 cup soup with meat, instant potatoes, beans, corn, tomatoes (8 g) Snk (PM): palmful of pretzels, SF popsicle, sometimes yogurt (12 g)  Fluid intake: 3 bottles of crystal light (51 oz) and water fountain and 11 oz protein shake  Estimated total protein intake: 46-58 g  Medications: see list Supplementation: "sometimes" takes MVI and hasn't started taking calcium or B12  CBG monitoring: 1 x day Average CBG per patient: 65-75 mg/dl Last patient reported [id number]: 6.5%  Using straws: No Drinking while eating: if she feels like she's choking, can't do it easily at work Hair loss: a little bit (hair is thin) Carbonated beverages: No N/V/D/C: has nausea sometimes with protein shake, constipation and takes Miralax sometimes Dumping syndrome: No  Recent  physical activity:  Walks at work from [employer] to machine  Progress Towards Goal(s):  In progress.  Handouts given during visit include:  Phase 3B High Protein + Non Starchy Vegetables   Nutritional Diagnosis:  [place]-3.3 Overweight/obesity related to past poor dietary habits and physical inactivity as evidenced by patient w/ recent sleeve gastrectomy surgery following dietary guidelines for continued weight loss.    Intervention:  Nutrition education/diet advancement.  Goals:  Follow Phase 3B: High Protein + Non-Starchy Vegetables  Eat 3-6 small meals/snacks, every 3-5 hrs  Increase lean protein foods to meet 60g goal  Increase fluid intake to 64oz +  Avoid drinking 15 minutes before, during and 30 minutes after eating  Aim for >30 min of physical activity daily (think about strength training)   Start taking multivitamins, calcium citrate, and B12  Try having a pickle instead of pretzels after a protein shakes  Think about splitting up the protein shake between morning and afternoon.  Aim to eat 2-3 oz of protein at meals and non starchy vegetable (chicken or fish or steak)   Think about joining [employer] program (call/email me if you need the contact info)  Teaching Method Utilized:  Visual Auditory Hands on  Barriers to learning/adherence to lifestyle change: none  Demonstrated degree of understanding via:  Teach Back   Monitoring/Evaluation:  Dietary intake, exercise, and body weight. Follow up in 1 months for 3 month post-op visit.

## 2013-12-02 ENCOUNTER — Ambulatory Visit: Payer: 59 | Admitting: Dietician

## 2014-01-08 ENCOUNTER — Ambulatory Visit: Payer: 59 | Admitting: Dietician

## 2014-10-24 IMAGING — CR DG CHEST 2V
3 series · 3 of 3 positions shown · non-contrast
Comparison: None.

CLINICAL DATA: Type 2 diabetes

EXAM:
CHEST  2 VIEW

[view not recorded (1 of 3)]
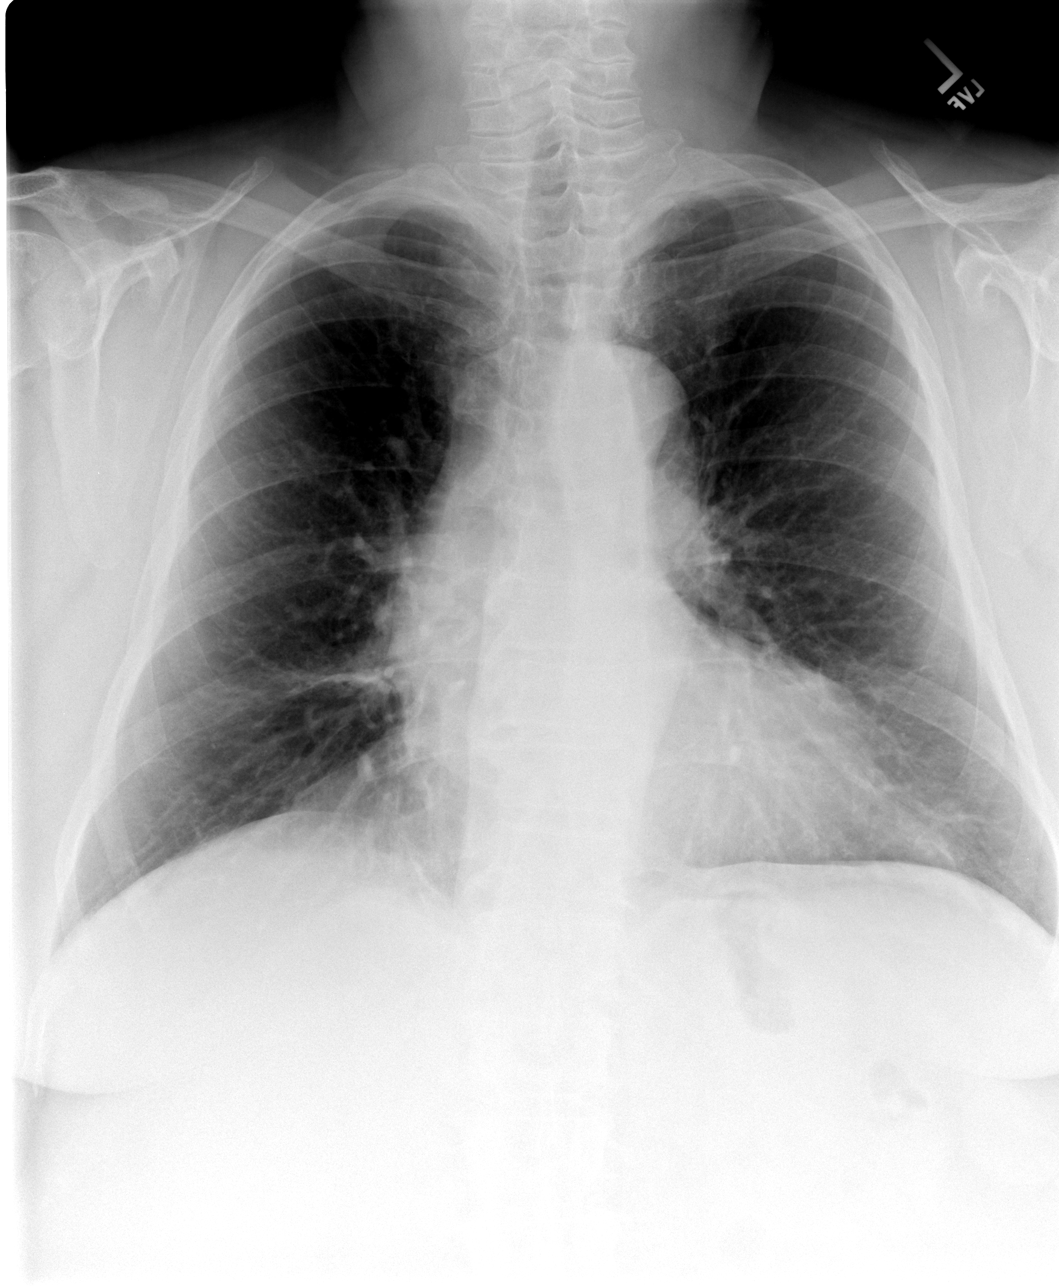

[view not recorded (2 of 3)]
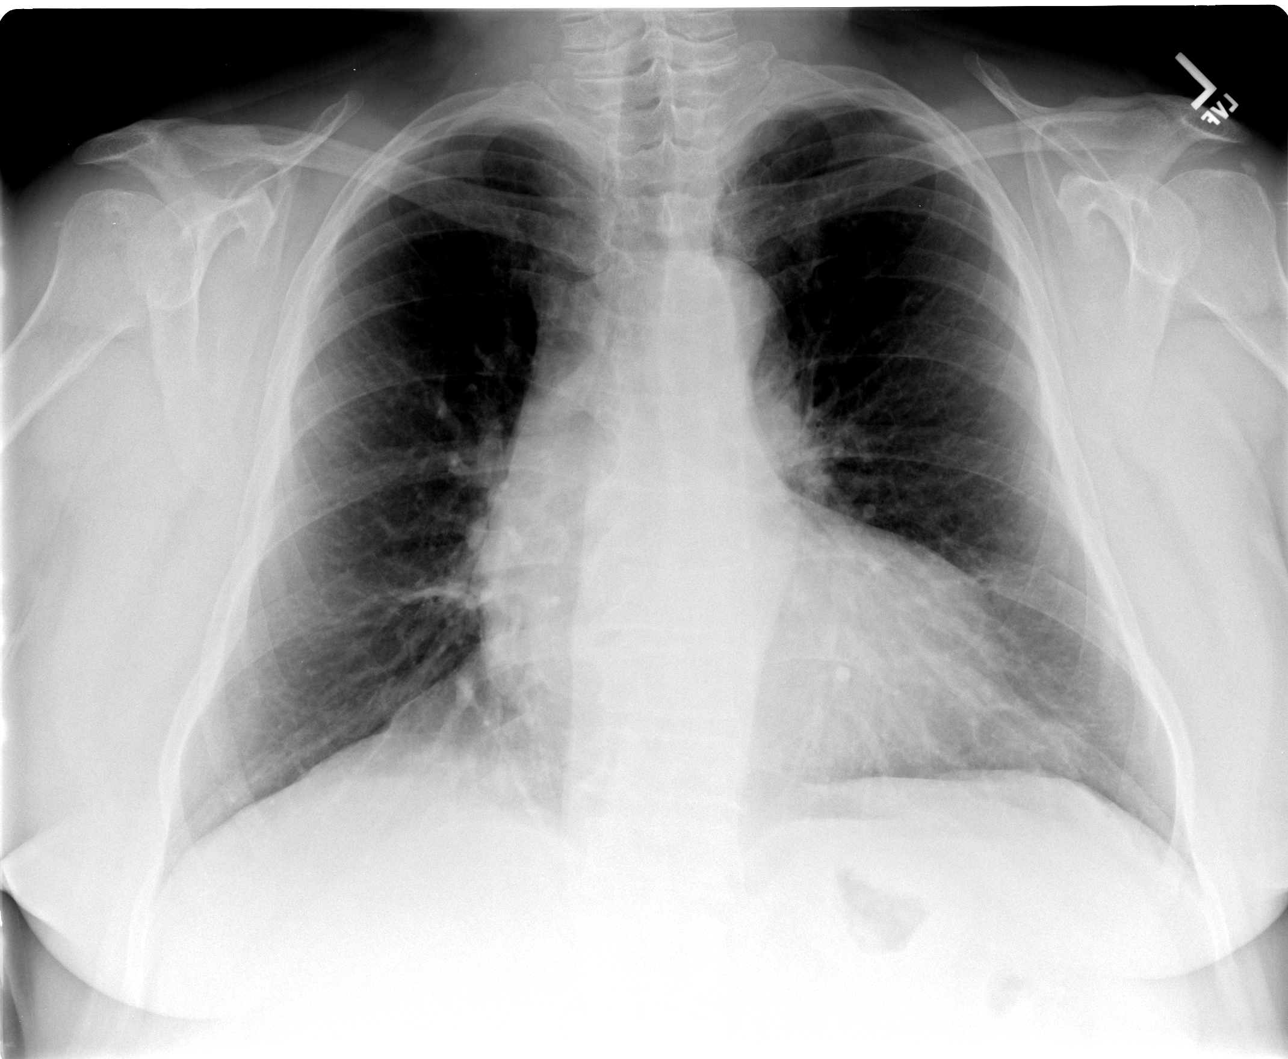

[view not recorded (3 of 3)]
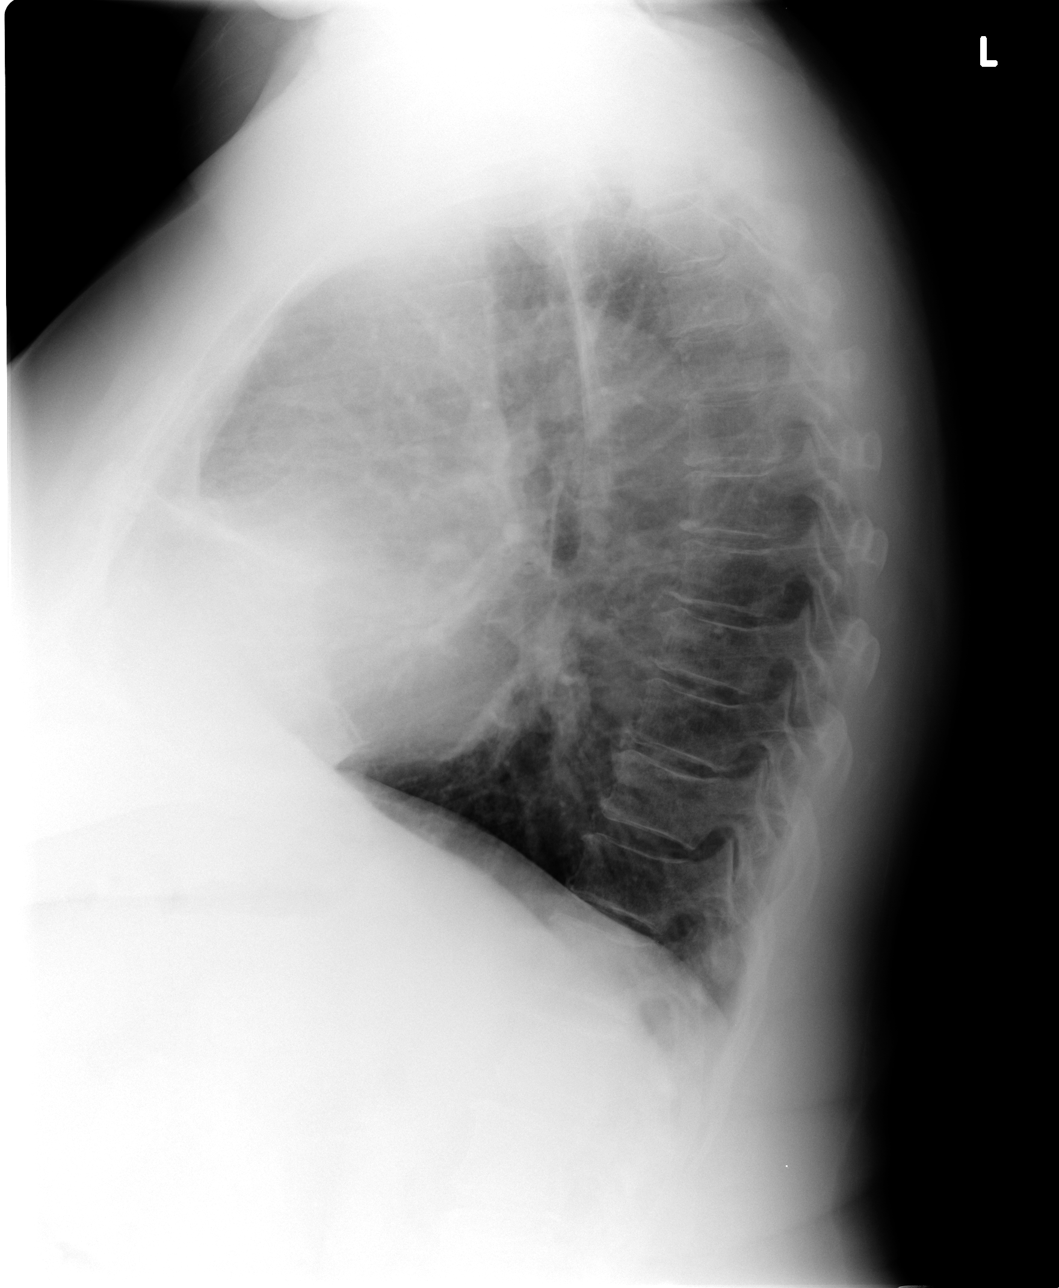

[3 of 3 positions shown; findings below may reference images not displayed]

FINDINGS: The examination is degraded due to patient body habitus.

Enlarged cardiac silhouette and mediastinal contours with mild
tortuosity of the thoracic aorta. A suspected mildly prominent
epicardial fat pad partially obscures the right heart border. There
is mild diffuse slightly nodular thickening of the pulmonary
interstitium. Minimal perihilar and bibasilar heterogeneous
opacities, likely atelectasis. No focal airspace opacities. No
pleural effusion pneumothorax. No evidence of edema. No acute osseus
abnormalities.
IMPRESSION: Cardiomegaly, bronchitic change and perihilar atelectasis without
acute cardiopulmonary disease.

## 2015-10-30 ENCOUNTER — Encounter (HOSPITAL_COMMUNITY): Payer: Self-pay

## 2015-11-24 ENCOUNTER — Telehealth: Payer: Self-pay | Admitting: Radiology

## 2015-11-24 NOTE — Telephone Encounter (Signed)
I have gotten a referral from Delbert HarnessMurphy wainer, placed in the folder, ok to schedule new patient appt for her, evaluate PMR

## 2015-11-25 NOTE — Telephone Encounter (Signed)
Patient scheduled.

## 2015-11-26 ENCOUNTER — Ambulatory Visit (INDEPENDENT_AMBULATORY_CARE_PROVIDER_SITE_OTHER): Payer: Self-pay | Admitting: Orthopedic Surgery

## 2016-01-06 NOTE — Progress Notes (Signed)
Office Visit Note  Patient: Tina Ortega             Date of Birth: 1946-01-20           MRN: [id number]             PCP: Galvin Proffer, MD Referring: Loreta Ave, MD Visit Date: 01/07/2016 Occupation: Machinist    Subjective:  Pain of the Left Knee; Pain of the Left Hip; and PMR (has seen Dr Corliss Skains in 2009 )   History of Present Illness: Tina Ortega is a 70 y.o. female is been seen in consultation per request of Dr. Eulah Pont. According to patient about 6 weeks ago she hold a muscle in her left lower extremity for which she was seen at Dr. Greig Right office she was given prednisone Dosepak which did not help. She went for a follow-up visit and so Dr. Eulah Pont who was concerned that she may have had recurrence of polymyalgia rheumatica for that reason she was sent back to me. She states the left lower extremity pain has been gradually getting better and is almost resolved now. She has some stiffness in her lower back which is tolerable. 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 She is very active and works about 60 hours per week between 2 jobs. She recalls that in 2009 I diagnosed her with polymyalgia rheumatica and she was on prednisone for about one year and then went into remission. She denies any proximal muscle weakness or tenderness or any difficulty getting up from chair. She denies any joint swelling. She reports having gastric bypass surgery in 2015 at the time she had multiple medical problems including diabetes, hypertension and sleep apnea which are resolved now.  Activities of Daily Living:  Patient reports morning stiffness for0 minutes.   Patient Denies nocturnal pain.  Difficulty dressing/grooming: Denies Difficulty climbing stairs: Denies Difficulty getting out of chair: Denies Difficulty using hands for taps, buttons, cutlery, and/or writing: Denies   Review of Systems  Constitutional: Positive for weight gain. Negative for  fatigue, night sweats, weight loss and weakness.  HENT: Negative for mouth sores, trouble swallowing, trouble swallowing, mouth dryness and nose dryness.   Eyes: Negative for pain, redness, visual disturbance and dryness.  Respiratory: Negative for cough, shortness of breath and difficulty breathing.   Cardiovascular: Negative for chest pain, palpitations, hypertension, irregular heartbeat and swelling in legs/feet.  Gastrointestinal: Negative for blood in stool, constipation and diarrhea.  Endocrine: Negative for increased urination.  Genitourinary: Negative for vaginal dryness.  Musculoskeletal: Positive for muscle tenderness. Negative for arthralgias, joint pain, joint swelling, myalgias, muscle weakness, morning stiffness and myalgias.  Skin: Negative for color change, rash, hair loss, skin tightness, ulcers and sensitivity to sunlight.  Allergic/Immunologic: Negative for susceptible to infections.  Neurological: Negative for dizziness, memory loss and night sweats.  Hematological: Negative for swollen glands.  Psychiatric/Behavioral: Positive for depressed mood. Negative for sleep disturbance. The patient is not nervous/anxious.     PMFS History:  Patient Active Problem List   Diagnosis Date Noted  . Status post gastric bypass for obesity 01/07/2016  . Polymyalgia rheumatica (HCC) 01/07/2016  . 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  . Lap sleeve gastrectomy and repair [facility] Sept 2015 10/01/2013  . OSA (obstructive sleep apnea) 07/12/2013  . Diabetes (HCC) 05/22/2013  . GERD (gastroesophageal reflux disease) 05/22/2013  . Dyslipidemia 05/22/2013  . Essential hypertension, benign 05/22/2013    Past Medical History:  Diagnosis Date  .  Arthritis   . Carpal tunnel syndrome on left   . Complication of anesthesia may 1977   spinal went numb from waist  up instead of waist down  . Diabetes mellitus without complication (HCC)   . GERD (gastroesophageal reflux disease)   . H/O hiatal hernia   .  Hyperlipidemia   . Hypertension    white coat syndrome  . Left shoulder pain    for last week  . Sleep apnea    borderline no cpap needed  . Spinal headache may 1977    Family History  Problem Relation Age of Onset  . Diabetes Mother   . Heart disease Mother   . Arthritis Father   . Arthritis Sister    Past Surgical History:  Procedure Laterality Date  . APPENDECTOMY  1965  . carpal tunnel repair Right yrs ago  . CESAREAN SECTION  1975, 1977  . LAPAROSCOPIC GASTRIC SLEEVE RESECTION N/A 10/01/2013   Procedure: LAPAROSCOPIC GASTRIC SLEEVE RESECTION WITH ENDOSCOPY, HIATAL HERNIA REPAIR ;  Surgeon: Wenda LowMatt Martin, MD;  Location: [facility];  Service: General;  Laterality: N/A;  . OVARIAN CYST SURGERY  1965  . TONSILLECTOMY  age 70   and adenoids   Social History   Social History Narrative  . No narrative on file     Objective: Vital Signs: BP (!) 150/74   Pulse 80   Resp 16   Ht 5' (1.524 m)   Wt 196 lb (88.9 kg)   BMI 38.28 kg/m    Physical Exam  Constitutional: She is oriented to person, place, and time. She appears well-developed and well-nourished.  HENT:  Head: Normocephalic and atraumatic.  Eyes: Conjunctivae and EOM are normal.  Neck: Normal range of motion.  Cardiovascular: Normal rate, regular rhythm, normal heart sounds and intact distal pulses.   Pulmonary/Chest: Effort normal and breath sounds normal.  Abdominal: Soft. Bowel sounds are normal.  Lymphadenopathy:    She has no cervical adenopathy.  Neurological: She is alert and oriented to person, place, and time.  Skin: Skin is warm and dry. Capillary refill takes less than 2 seconds.  Psychiatric: She has a normal mood and affect. Her behavior is normal.  Nursing note and vitals reviewed.    Musculoskeletal Exam: C-spine and thoracic lumbar spine good range of motion. Shoulder joints elbow joints wrist joint MCPs PIPs with good range of motion she has thickening of PIP/DIP joints in her hands consistent  with osteoarthritis. She has good range of motion of her hip joints knee joints ankles MTPs she is thickening of PIP/DIP in her feet. There was no synovitis on examination she had no muscular weakness on examination. She was able to get up from the chair without any difficulty.  CDAI Exam: No CDAI exam completed.    Investigation: No additional findings.   Imaging: No results found.  Speciality Comments: No specialty comments available.    Procedures:  No procedures performed Allergies: Penicillins   Assessment / Plan:     Visit Diagnoses: Polymyalgia rheumatica (HCC) history of polymyalgia rheumatica in the past but she has no features of polymyalgia rheumatica currently.  History of left lower extremity pain which is started about 6 weeks ago and the symptoms are gradually getting better. She states the pain is almost resolved.  Osteoarthritis hands: Joint protection and muscle strengthening was discussed.  DDD lumbar spine: Per report from Dr. Janeece FittingMurphy Wainer's notes. She does limp a little bit  while she walks. She states she's not and  discomfort.  Status post gastric bypass for obesity : She reports having weight loss after surgery but has gained some weight again. Weight loss diet and exercise was discussed today.  In the chart several medical problems have been listed including DVTs hypertension, hyperlipidemia and sleep apnea. According to patient all of these problems have resolved since she had weight loss.  No further testing was performed. I've advised her to return and call me back in case some she has recurrence of her symptoms.   Orders: No orders of the defined types were placed in this encounter.  No orders of the defined types were placed in this encounter.   Face-to-face time spent with patient was 35 minutes. 50% of time was spent in counseling and coordination of care.  Follow-Up Instructions: Return if symptoms worsen or fail to improve, for  myalgia.   Pollyann SavoyShaili Maxum Cassarino, MD

## 2016-01-07 ENCOUNTER — Ambulatory Visit (INDEPENDENT_AMBULATORY_CARE_PROVIDER_SITE_OTHER): Payer: 59 | Admitting: Rheumatology

## 2016-01-07 ENCOUNTER — Encounter: Payer: Self-pay | Admitting: Rheumatology

## 2016-01-07 VITALS — BP 150/74 | HR 80 | Resp 16 | Ht 60.0 in | Wt 196.0 lb

## 2016-01-07 DIAGNOSIS — M353 Polymyalgia rheumatica: Secondary | ICD-10-CM | POA: Diagnosis not present

## 2016-01-07 DIAGNOSIS — Z9884 Bariatric surgery status: Secondary | ICD-10-CM

## 2016-01-07 DIAGNOSIS — M19041 Primary osteoarthritis, right hand: Secondary | ICD-10-CM

## 2016-01-07 DIAGNOSIS — M19042 Primary osteoarthritis, left hand: Secondary | ICD-10-CM

## 2016-01-07 DIAGNOSIS — M47816 Spondylosis without myelopathy or radiculopathy, lumbar region: Secondary | ICD-10-CM | POA: Diagnosis not present

## 2016-01-07 HISTORY — DX: Polymyalgia rheumatica: M35.3

## 2016-01-07 HISTORY — DX: Spondylosis without myelopathy or radiculopathy, lumbar region: M47.816

## 2016-01-07 HISTORY — DX: Bariatric surgery status: Z98.84

## 2016-02-15 ENCOUNTER — Ambulatory Visit: Payer: Self-pay | Admitting: Rheumatology

## 2016-05-03 ENCOUNTER — Encounter (HOSPITAL_COMMUNITY): Payer: Self-pay

## 2016-10-18 DIAGNOSIS — I619 Nontraumatic intracerebral hemorrhage, unspecified: Secondary | ICD-10-CM | POA: Insufficient documentation

## 2016-10-18 HISTORY — DX: Nontraumatic intracerebral hemorrhage, unspecified: I61.9

## 2016-12-21 DIAGNOSIS — Q282 Arteriovenous malformation of cerebral vessels: Secondary | ICD-10-CM | POA: Insufficient documentation

## 2016-12-21 HISTORY — DX: Arteriovenous malformation of cerebral vessels: Q28.2

## 2017-02-07 DIAGNOSIS — I629 Nontraumatic intracranial hemorrhage, unspecified: Secondary | ICD-10-CM

## 2017-02-07 HISTORY — DX: Nontraumatic intracranial hemorrhage, unspecified: I62.9

## 2017-04-12 ENCOUNTER — Encounter: Payer: Self-pay | Admitting: Gastroenterology

## 2017-05-05 ENCOUNTER — Encounter (HOSPITAL_COMMUNITY): Payer: Self-pay

## 2021-09-17 DIAGNOSIS — R9389 Abnormal findings on diagnostic imaging of other specified body structures: Secondary | ICD-10-CM | POA: Insufficient documentation

## 2021-09-17 DIAGNOSIS — N950 Postmenopausal bleeding: Secondary | ICD-10-CM | POA: Insufficient documentation

## 2021-09-17 HISTORY — DX: Abnormal findings on diagnostic imaging of other specified body structures: R93.89

## 2021-09-17 HISTORY — DX: Postmenopausal bleeding: N95.0

## 2021-11-01 DIAGNOSIS — I70223 Atherosclerosis 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, bilateral legs: Secondary | ICD-10-CM | POA: Insufficient documentation

## 2021-11-01 HISTORY — DX: Atherosclerosis 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, bilateral legs: I70.223

## 2021-12-24 DIAGNOSIS — R011 Cardiac murmur, unspecified: Secondary | ICD-10-CM | POA: Diagnosis not present

## 2021-12-28 DIAGNOSIS — I70223 Atherosclerosis 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, bilateral legs: Secondary | ICD-10-CM | POA: Diagnosis not present

## 2021-12-31 DIAGNOSIS — I6523 Occlusion and stenosis of bilateral carotid arteries: Secondary | ICD-10-CM | POA: Diagnosis not present

## 2021-12-31 DIAGNOSIS — R0989 Other specified symptoms and signs involving the circulatory and respiratory systems: Secondary | ICD-10-CM | POA: Diagnosis not present

## 2022-01-03 DIAGNOSIS — E782 Mixed hyperlipidemia: Secondary | ICD-10-CM | POA: Diagnosis not present

## 2022-01-03 DIAGNOSIS — E119 Type 2 diabetes mellitus without complications: Secondary | ICD-10-CM | POA: Diagnosis not present

## 2022-01-04 DIAGNOSIS — E1165 Type 2 diabetes mellitus with hyperglycemia: Secondary | ICD-10-CM | POA: Diagnosis not present

## 2022-01-04 DIAGNOSIS — D518 Other vitamin B12 deficiency anemias: Secondary | ICD-10-CM | POA: Diagnosis not present

## 2022-01-04 DIAGNOSIS — R590 Localized enlarged lymph nodes: Secondary | ICD-10-CM | POA: Diagnosis not present

## 2022-01-04 DIAGNOSIS — F330 Major depressive disorder, recurrent, mild: Secondary | ICD-10-CM | POA: Diagnosis not present

## 2022-01-04 DIAGNOSIS — E782 Mixed hyperlipidemia: Secondary | ICD-10-CM | POA: Diagnosis not present

## 2022-01-04 DIAGNOSIS — E559 Vitamin D deficiency, unspecified: Secondary | ICD-10-CM | POA: Diagnosis not present

## 2022-01-04 DIAGNOSIS — E038 Other specified hypothyroidism: Secondary | ICD-10-CM | POA: Diagnosis not present

## 2022-01-04 DIAGNOSIS — M150 Primary generalized (osteo)arthritis: Secondary | ICD-10-CM | POA: Diagnosis not present

## 2022-01-04 DIAGNOSIS — E119 Type 2 diabetes mellitus without complications: Secondary | ICD-10-CM | POA: Diagnosis not present

## 2022-01-04 DIAGNOSIS — I10 Essential (primary) hypertension: Secondary | ICD-10-CM | POA: Diagnosis not present

## 2022-01-07 DIAGNOSIS — R2231 Localized swelling, mass and lump, right upper limb: Secondary | ICD-10-CM | POA: Diagnosis not present

## 2022-01-11 DIAGNOSIS — I77811 Abdominal aortic ectasia: Secondary | ICD-10-CM | POA: Diagnosis not present

## 2022-01-14 DIAGNOSIS — E042 Nontoxic multinodular goiter: Secondary | ICD-10-CM | POA: Diagnosis not present

## 2022-01-14 DIAGNOSIS — R221 Localized swelling, mass and lump, neck: Secondary | ICD-10-CM | POA: Diagnosis not present

## 2022-01-17 DIAGNOSIS — Z9181 History of falling: Secondary | ICD-10-CM | POA: Diagnosis not present

## 2022-01-17 DIAGNOSIS — G459 Transient cerebral ischemic attack, unspecified: Secondary | ICD-10-CM | POA: Diagnosis not present

## 2022-01-17 DIAGNOSIS — I454 Nonspecific intraventricular block: Secondary | ICD-10-CM | POA: Diagnosis not present

## 2022-01-17 DIAGNOSIS — Z8673 Personal history of transient ischemic attack (TIA), and cerebral infarction without residual deficits: Secondary | ICD-10-CM | POA: Diagnosis not present

## 2022-01-17 DIAGNOSIS — R29810 Facial weakness: Secondary | ICD-10-CM | POA: Diagnosis not present

## 2022-01-17 DIAGNOSIS — I10 Essential (primary) hypertension: Secondary | ICD-10-CM | POA: Diagnosis not present

## 2022-01-17 DIAGNOSIS — Z043 Encounter for examination and observation following other accident: Secondary | ICD-10-CM | POA: Diagnosis not present

## 2022-01-17 DIAGNOSIS — I6523 Occlusion and stenosis of bilateral carotid arteries: Secondary | ICD-10-CM | POA: Diagnosis not present

## 2022-01-17 DIAGNOSIS — E119 Type 2 diabetes mellitus without complications: Secondary | ICD-10-CM | POA: Diagnosis not present

## 2022-01-17 DIAGNOSIS — G629 Polyneuropathy, unspecified: Secondary | ICD-10-CM | POA: Diagnosis not present

## 2022-01-17 DIAGNOSIS — Q283 Other malformations of cerebral vessels: Secondary | ICD-10-CM | POA: Diagnosis not present

## 2022-01-17 DIAGNOSIS — Z79899 Other long term (current) drug therapy: Secondary | ICD-10-CM | POA: Diagnosis not present

## 2022-01-17 DIAGNOSIS — I639 Cerebral infarction, unspecified: Secondary | ICD-10-CM | POA: Diagnosis not present

## 2022-01-17 DIAGNOSIS — Z7984 Long term (current) use of oral hypoglycemic drugs: Secondary | ICD-10-CM | POA: Diagnosis not present

## 2022-01-17 DIAGNOSIS — R296 Repeated falls: Secondary | ICD-10-CM | POA: Diagnosis not present

## 2022-01-17 DIAGNOSIS — I672 Cerebral atherosclerosis: Secondary | ICD-10-CM | POA: Diagnosis not present

## 2022-01-17 DIAGNOSIS — E785 Hyperlipidemia, unspecified: Secondary | ICD-10-CM | POA: Diagnosis not present

## 2022-01-17 DIAGNOSIS — R9431 Abnormal electrocardiogram [ECG] [EKG]: Secondary | ICD-10-CM | POA: Diagnosis not present

## 2022-01-17 DIAGNOSIS — R4781 Slurred speech: Secondary | ICD-10-CM | POA: Diagnosis not present

## 2022-01-17 DIAGNOSIS — R0602 Shortness of breath: Secondary | ICD-10-CM | POA: Diagnosis not present

## 2022-01-18 DIAGNOSIS — G459 Transient cerebral ischemic attack, unspecified: Secondary | ICD-10-CM | POA: Diagnosis not present

## 2022-01-18 DIAGNOSIS — Z9181 History of falling: Secondary | ICD-10-CM | POA: Diagnosis not present

## 2022-01-18 DIAGNOSIS — I340 Nonrheumatic mitral (valve) insufficiency: Secondary | ICD-10-CM | POA: Diagnosis not present

## 2022-01-18 DIAGNOSIS — I361 Nonrheumatic tricuspid (valve) insufficiency: Secondary | ICD-10-CM | POA: Diagnosis not present

## 2022-01-18 DIAGNOSIS — E119 Type 2 diabetes mellitus without complications: Secondary | ICD-10-CM | POA: Diagnosis not present

## 2022-01-18 DIAGNOSIS — R42 Dizziness and giddiness: Secondary | ICD-10-CM | POA: Diagnosis not present

## 2022-01-19 DIAGNOSIS — G459 Transient cerebral ischemic attack, unspecified: Secondary | ICD-10-CM | POA: Diagnosis not present

## 2022-01-19 DIAGNOSIS — E119 Type 2 diabetes mellitus without complications: Secondary | ICD-10-CM | POA: Diagnosis not present

## 2022-01-19 DIAGNOSIS — Z9181 History of falling: Secondary | ICD-10-CM | POA: Diagnosis not present

## 2022-01-21 DIAGNOSIS — E669 Obesity, unspecified: Secondary | ICD-10-CM | POA: Diagnosis not present

## 2022-01-21 DIAGNOSIS — R5383 Other fatigue: Secondary | ICD-10-CM | POA: Diagnosis not present

## 2022-01-21 DIAGNOSIS — R2681 Unsteadiness on feet: Secondary | ICD-10-CM | POA: Diagnosis not present

## 2022-01-21 DIAGNOSIS — I10 Essential (primary) hypertension: Secondary | ICD-10-CM | POA: Diagnosis not present

## 2022-01-21 DIAGNOSIS — I679 Cerebrovascular disease, unspecified: Secondary | ICD-10-CM | POA: Diagnosis not present

## 2022-01-21 DIAGNOSIS — F3341 Major depressive disorder, recurrent, in partial remission: Secondary | ICD-10-CM | POA: Diagnosis not present

## 2022-01-21 DIAGNOSIS — M159 Polyosteoarthritis, unspecified: Secondary | ICD-10-CM | POA: Diagnosis not present

## 2022-01-21 DIAGNOSIS — Z6835 Body mass index (BMI) 35.0-35.9, adult: Secondary | ICD-10-CM | POA: Diagnosis not present

## 2022-01-21 DIAGNOSIS — E1169 Type 2 diabetes mellitus with other specified complication: Secondary | ICD-10-CM | POA: Diagnosis not present

## 2022-03-01 ENCOUNTER — Ambulatory Visit: Payer: Medicare HMO | Admitting: Diagnostic Neuroimaging

## 2022-03-01 ENCOUNTER — Encounter: Payer: Self-pay | Admitting: Diagnostic Neuroimaging

## 2022-03-01 VITALS — BP 142/72 | HR 90 | Ht 59.0 in | Wt 180.2 lb

## 2022-03-01 DIAGNOSIS — R269 Unspecified abnormalities of gait and mobility: Secondary | ICD-10-CM | POA: Diagnosis not present

## 2022-03-01 NOTE — Patient Instructions (Signed)
GAIT DIFFICULTY  - due to major cerebellar hemorrhage in 2018; also with lumbar radiculopathy + diabetic neuropathy - use walker at all times

## 2022-03-01 NOTE — Progress Notes (Signed)
[facility]  PATIENT: Tina Ortega DOB: August 20, 1945  REFERRING CLINICIAN: Simone Curia, MD HISTORY FROM: patient  REASON FOR VISIT: new consult   HISTORICAL  CHIEF COMPLAINT:  Chief Complaint  Patient presents with   New Patient (Initial Visit)    Patient in room #6 with her son Barbara Cower. Patient here today to discuss her hx CVA and TIA.    HISTORY OF PRESENT ILLNESS:   77 year old female here for evaluation of gait and balance difficulty.  2018 patient had sudden onset of nausea and gait difficulty.  Severe headache.  Went to the hospital was diagnosed with midline cerebellar vermis hemorrhage.  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  This was monitored but ultimately treated with gamma knife therapy.  Since that time no recurrence of AVM or hemorrhage.  She was using a walker initially but eventually was able to slightly improved.  Over the last 2 years she has had worsening tremor in her left hand and gait difficulty.  She has had follow-up imaging but no new findings have been found.  Also has some low back pain issues.  Balance is off.  Feels dizzy and spinning sensation at times.   REVIEW OF SYSTEMS: Full 14 system review of systems performed and negative with exception of: as per HPI.  ALLERGIES: Allergies  Allergen Reactions   Penicillins Hives    HOME MEDICATIONS: Outpatient Medications Prior to Visit  Medication Sig Dispense Refill   aspirin EC 81 MG tablet Take 81 mg by mouth daily. Swallow whole.     atorvastatin (LIPITOR) 40 MG tablet Take 40 mg by mouth daily.     Cholecalciferol (VITAMIN D-1000 MAX ST) 25 MCG (1000 UT) tablet Take 1,000 Units by mouth daily.     DULoxetine (CYMBALTA) 60 MG capsule Take 60 mg by mouth every morning.      hydrochlorothiazide (HYDRODIURIL) 25 MG tablet Take 25 mg by mouth daily.     ibuprofen (ADVIL) 200 MG tablet Take 200 mg by mouth every 4  (four) hours as needed.     levothyroxine (SYNTHROID) 50 MCG tablet Take 50 mcg by mouth daily before breakfast.     linaclotide (LINZESS) 145 MCG CAPS capsule Take 145 mcg by mouth as needed.     lisinopril (ZESTRIL) 40 MG tablet Take 40 mg by mouth daily.     losartan (COZAAR) 100 MG tablet Take 100 mg by mouth daily.     meloxicam (MOBIC) 15 MG tablet      sennosides-docusate sodium (SENOKOT-S) 8.6-50 MG tablet Take 1 tablet by mouth daily.     sitaGLIPtin (JANUVIA) 100 MG tablet Take 100 mg by mouth daily.     No facility-administered medications prior to visit.    PAST MEDICAL HISTORY: Past Medical History:  Diagnosis Date   Arthritis    Carpal tunnel syndrome on left    Complication of anesthesia may 1977   spinal went numb from waist  up instead of waist down   Diabetes mellitus without complication (HCC)    GERD (gastroesophageal reflux disease)    H/O hiatal hernia    Hyperlipidemia    Hypertension    white coat syndrome   Left shoulder pain    for last week   Sleep apnea    borderline no cpap needed   Spinal headache may 1977    PAST SURGICAL HISTORY: Past Surgical History:  Procedure Laterality Date   APPENDECTOMY  1965   carpal  tunnel repair Right yrs ago   [place] GASTRIC SLEEVE RESECTION N/A 10/01/2013   Procedure: LAPAROSCOPIC GASTRIC SLEEVE RESECTION WITH ENDOSCOPY, HIATAL HERNIA REPAIR ;  Surgeon: Kaylyn Lim, MD;  Location: [facility];  Service: General;  Laterality: N/A;   OVARIAN CYST [place]  age 64   and adenoids    FAMILY HISTORY: Family History  Problem Relation Age of Onset   Diabetes Mother    Heart disease Mother    Arthritis Father    Arthritis Sister     SOCIAL HISTORY: Social History   Socioeconomic History   Marital status: Divorced    Spouse name: Not on file   Number of children: Not on file   Years of education: Not on file   Highest education level: Not on file   Occupational History   Not on file  Tobacco Use   Smoking status: Former    Packs/day: 0.75    Years: 10.00    Total pack years: 7.50    Types: Cigarettes    Quit date: 05/22/1985    Years since quitting: 36.8   Smokeless tobacco: Never  Substance and Sexual Activity   Alcohol use: No   Drug use: No   Sexual activity: Not on file  Other Topics Concern   Not on file  Social History Narrative   Not on file   Social Determinants of Health   Financial Resource Strain: Not on file  Food Insecurity: Not on file  Transportation Needs: Not on file  Physical Activity: Not on file  Stress: Not on file  Social Connections: Not on file  Intimate Partner Violence: Not on file     PHYSICAL EXAM  GENERAL EXAM/CONSTITUTIONAL: Vitals:  Vitals:   03/01/22 0938  BP: (!) 142/72  Pulse: 90  Weight: 180 lb 3.2 oz (81.7 kg)  Height: 4\' 11"  (1.499 m)   Body mass index is 36.4 kg/m. Wt Readings from Last 3 Encounters:  03/01/22 180 lb 3.2 oz (81.7 kg)  01/07/16 196 lb (88.9 kg)  11/18/13 178 lb (80.7 kg)   Patient is in no distress; well developed, nourished and groomed; neck is supple  CARDIOVASCULAR: Examination of carotid arteries is normal; no carotid bruits Regular rate and rhythm, SYSTOLIC MURMUR RIGHT PRECORDIAL REGION Examination of peripheral vascular system by observation and palpation is normal  EYES: Ophthalmoscopic exam of optic discs and posterior segments is normal; no papilledema or hemorrhages No results found.  MUSCULOSKELETAL: Gait, strength, tone, movements noted in Neurologic exam below  NEUROLOGIC: MENTAL STATUS:      No data to display         awake, alert, oriented to person, place and time recent and remote memory intact normal attention and concentration language fluent, comprehension intact, naming intact fund of knowledge appropriate  CRANIAL NERVE:  2nd - no papilledema on fundoscopic exam 2nd, 3rd, 4th, 6th - pupils equal and  reactive to light, visual fields full to confrontation, extraocular muscles intact, no nystagmus 5th - facial sensation symmetric 7th - facial strength symmetric 8th - hearing intact 9th - palate elevates symmetrically, uvula midline 11th - shoulder shrug symmetric 12th - tongue protrusion midline  MOTOR:  normal bulk and tone, full strength in the BUE, BLE  SENSORY:  normal and symmetric to light touch, temperature, vibration; decr in left foot  COORDINATION:  finger-nose-finger, fine finger movements --> LUE DYSMETRIA  REFLEXES:  deep tendon reflexes TRACE and symmetric  GAIT/STATION:  narrow based gait; SHORT STEPS; VERY UNSTEADY     DIAGNOSTIC DATA (LABS, IMAGING, TESTING) - I reviewed patient records, labs, notes, testing and imaging myself where available.  Lab Results  Component Value Date   WBC 7.7 10/04/2013   HGB 13.3 10/04/2013   HCT 40.8 10/04/2013   MCV 90.5 10/04/2013   PLT 205 10/04/2013      Component Value Date/Time   NA 141 09/27/2013 1455   K 4.9 09/27/2013 1455   CL 101 09/27/2013 1455   CO2 29 09/27/2013 1455   GLUCOSE 85 09/27/2013 1455   BUN 20 09/27/2013 1455   CREATININE 0.58 10/01/2013 1337   CREATININE 0.79 06/19/2013 1717   CALCIUM 10.3 09/27/2013 1455   PROT 7.7 09/27/2013 1455   ALBUMIN 4.0 09/27/2013 1455   AST 18 09/27/2013 1455   ALT 16 09/27/2013 1455   ALKPHOS 121 (H) 09/27/2013 1455   BILITOT 0.7 09/27/2013 1455   GFRNONAA >90 10/01/2013 1337   GFRAA >90 10/01/2013 1337   Lab Results  Component Value Date   CHOL 195 06/19/2013   HDL 39 (L) 06/19/2013   LDLCALC 108 (H) 06/19/2013   TRIG 238 (H) 06/19/2013   CHOLHDL 5.0 06/19/2013   Lab Results  Component Value Date   HGBA1C 6.8 (H) 10/01/2013   No results found for: "VITAMINB12" Lab Results  Component Value Date   TSH 2.816 06/19/2013    10/14/16 CT head - Stable intraparenchymal hematoma within the vermis with extension into the fourth ventricle. No  evidence of hydrocephalus.   10/14/16 MRI brain  - The presence of a developmental venous anomaly in the superior cerebellum adjacent to the midline cerebellar hemorrhage which compresses the fourth ventricle could signal presence of a cavernous this malformation obscured by the susceptibility artifact of the hematoma. A follow-up in 3 months might be considered to reevaluate for underlying lesion after time for resolution or partial resolution of the hematoma. No lesion or abnormal enhancement is discernible separate from the hematoma and the developmental venous anomaly.   10/17/16 cerebral angiogram 1. Vertebral artery injection demonstrates early venous drainage, first seen in late arterial phase, into a large central cerebellar vein near the vermis which drains rostrally and posteriorly. There is no clear arteriovenous nidus associated with the vein. The vein has an appearance of a caput medusae which is most consistent with developmental venous anomaly. This shunting may represent either arteriovenous malformation with nidus that has been tamponaded by the hemorrhage versus arteriovenous shunting at the site of cerebellar injury. Rarely developmental venous anomalies may be high flow with shunting. Cavernous malformations, a potential source of hemorrhage, are often associated with developmental venous anomalies but are angiographically occult and would not be seen on angiogram. Repeat catheter angiogram after resolution of blood products is recommended.  2. Otherwise normal cerebral arteriogram.   11/07/16 cerebral angiogram 1. Via right (dominant) vertebral artery injection, early venous drainage is seen in the cerebellar vermis with early opacification of the large cerebellar draining vein during late arterial phase. Via left vertebral artery (terminates in PICA) injection, the same draining vein appears to be a developmental venous anomaly as the vein opacifies in phase with other veins. There is  no definitive arteriovenous malformation nidus on right vertebral artery injection but the early venous opacification is much earlier than seen with the left vertebral artery-posterior inferior cerebellar artery injection, which suggests a pathologic arteriovenous shunt. Predominant venous drainage is to the tentorium and then transverse sinus via an inferior  vermian vein.  2. Otherwise normal cerebral arteriogram.   01/18/22 MRI brain -No acute abnormality -Questionable deep cerebellar encephalomalacia, chronic blood products, cerebellar developmental venous anomaly, small chronic infarcts.    ASSESSMENT AND PLAN  77 y.o. year old female here with:   Dx:  1. Gait difficulty     PLAN:  CHRONIC GAIT DIFFICULTY (since 2018; worsening in last few years) - due to major cerebellar hemorrhage in 2018; also with lumbar radiculopathy + diabetic neuropathy + knee arthitis  - use walker at all times; consider PT evaluation - check MRI lumbar spine (spinal stenosis evaluation)  Orders Placed This Encounter  Procedures   MR [place]   Return for pending test results, pending if symptoms worsen or fail to improve.    Penni Bombard, MD 6/0/7371, 06:26 AM Certified in Neurology, Neurophysiology and Neuroimaging  [facility] [address], [place], [place] [address] [phone]

## 2022-03-14 ENCOUNTER — Telehealth: Payer: Self-pay | Admitting: Diagnostic Neuroimaging

## 2022-03-14 NOTE — Telephone Encounter (Signed)
Craig Staggers: [id number] exp. 03/14/22-04/13/22 sent to GI

## 2022-03-20 ENCOUNTER — Ambulatory Visit
Admission: RE | Admit: 2022-03-20 | Discharge: 2022-03-20 | Disposition: A | Discharge status: Home / Self Care | Payer: Medicare HMO | Source: Ambulatory Visit | Attending: Diagnostic Neuroimaging | Admitting: Diagnostic Neuroimaging

## 2022-03-20 DIAGNOSIS — R269 Unspecified abnormalities of gait and mobility: Secondary | ICD-10-CM

## 2022-03-22 ENCOUNTER — Telehealth: Payer: Self-pay | Admitting: Neurology

## 2022-03-22 DIAGNOSIS — R269 Unspecified abnormalities of gait and mobility: Secondary | ICD-10-CM

## 2022-03-22 NOTE — Telephone Encounter (Signed)
Called the patient and advised that Dr Leta Baptist reviewed the lumbar MRI result. Informed overall there was general as we age changes. Advised that there was no concern of narrowing on the spine and no concern for nerve impingement. Pt verbalized understanding. Pt had no questions at this time but was encouraged to call back if questions arise.

## 2022-03-22 NOTE — Telephone Encounter (Signed)
-----   Message from Penni Bombard, MD sent at 03/21/2022  5:03 PM EST ----- Mild degenerative changes. No major spinal stenosis. Please call patient. Continue current plan. -VRP

## 2022-04-05 ENCOUNTER — Telehealth: Payer: Self-pay | Admitting: Diagnostic Neuroimaging

## 2022-04-05 NOTE — Addendum Note (Signed)
Addended by: Gerline Legacy C on: 04/05/2022 04:00 PM   Modules accepted: Orders

## 2022-04-05 NOTE — Telephone Encounter (Signed)
Pt called stating that she is still falling and she would like for a PT referral put in for her.

## 2022-04-05 NOTE — Telephone Encounter (Signed)
Order for referral to PT placed to assist with balance and gait difficulty. Dr Leta Baptist had stated could consider this as next step.

## 2022-04-05 NOTE — Telephone Encounter (Signed)
Referral for physical therapy sent through [facility] to [facility]. [phone] 

## 2022-06-30 DIAGNOSIS — I352 Nonrheumatic aortic (valve) stenosis with insufficiency: Secondary | ICD-10-CM | POA: Diagnosis not present

## 2022-06-30 DIAGNOSIS — I361 Nonrheumatic tricuspid (valve) insufficiency: Secondary | ICD-10-CM | POA: Diagnosis not present

## 2023-07-10 ENCOUNTER — Ambulatory Visit (HOSPITAL_BASED_OUTPATIENT_CLINIC_OR_DEPARTMENT_OTHER): Admitting: Family Medicine

## 2023-07-10 ENCOUNTER — Encounter (HOSPITAL_BASED_OUTPATIENT_CLINIC_OR_DEPARTMENT_OTHER): Payer: Self-pay | Admitting: Family Medicine

## 2023-07-10 VITALS — BP 181/74 | HR 97 | Temp 98.5°F | Resp 16 | Ht <= 58 in | Wt 162.5 lb

## 2023-07-10 DIAGNOSIS — Z7409 Other reduced mobility: Secondary | ICD-10-CM | POA: Insufficient documentation

## 2023-07-10 DIAGNOSIS — E785 Hyperlipidemia, unspecified: Secondary | ICD-10-CM | POA: Diagnosis not present

## 2023-07-10 DIAGNOSIS — R269 Unspecified abnormalities of gait and mobility: Secondary | ICD-10-CM | POA: Insufficient documentation

## 2023-07-10 DIAGNOSIS — R011 Cardiac murmur, unspecified: Secondary | ICD-10-CM

## 2023-07-10 DIAGNOSIS — I2510 Atherosclerotic heart disease of native coronary artery without angina pectoris: Secondary | ICD-10-CM

## 2023-07-10 DIAGNOSIS — I160 Hypertensive urgency: Secondary | ICD-10-CM

## 2023-07-10 HISTORY — DX: Atherosclerotic heart disease of native coronary artery without angina pectoris: I25.10

## 2023-07-10 HISTORY — DX: Unspecified abnormalities of gait and mobility: R26.9

## 2023-07-10 HISTORY — DX: Cardiac murmur, unspecified: R01.1

## 2023-07-10 HISTORY — DX: Other reduced mobility: Z74.09

## 2023-07-10 NOTE — Assessment & Plan Note (Addendum)
 Stable.  Cautioned about today's poor BP.  She feels her standing home BP is controlled and declines further assistance for now.  Extended discussion about reducing her fall risk, etc.  Request records from Dr. Merlyn Starring.  Referred to Dr. Krasowski for an Echo, and possibly additional Cardiac testing as she is at least moderate risk and can't walk much.  Will obtain fasting labs at her next visit.  Trial of Coenzyme Q-10 for possible statin induced myalgias.  Might function better w/o the statin, but obviously I'm not comfortable with stopping it at this time.  She agrees to stop the Mobic, since she prefers to remain on the Advil.

## 2023-07-10 NOTE — Progress Notes (Signed)
 "  Established Patient Office Visit  Subjective   Patient ID: Tina Ortega, female    DOB: [birth year]  Age: 78 y.o. MRN: [id number]  Chief Complaint  Patient presents with   Establish Care    Pt. Here to establish care.     F/u as above.  Tina Ortega is frustrated with her chronic gait disorder.  Frequent falls.  Known to [facility], with that note appreciated.  History of strokes, etc noted.  Tina Ortega is admittedly noncompliant with her walker.  Has had plenty of home PT already.  They have already been blunt about her need to use the walker all of the time.  Some concerns about statin mediated myalgias.  Admits to taking Advil with her Meloxicam  together.    Past Medical History:  Diagnosis Date   Diabetes mellitus type 2 with complications (HCC)    Diabetic neuropathy (HCC)    GERD (gastroesophageal reflux disease)    H/O hiatal hernia    History of stroke    (Hemorrhagic), known to Neuro   Hyperlipidemia    Hypertension    white coat syndrome   Lumbar radiculopathy    known to Neuro   Multifactorial gait disorder    known to [facility]   Osteoarthritis    Sleep apnea    borderline no cpap needed    Outpatient Encounter Medications as of 07/10/2023  Medication Sig   acetaminophen  (TYLENOL ) 500 MG tablet Take 500 mg by mouth.   Cyanocobalamin  (B-12) 5000 MCG SUBL Place 5,000 mcg under the tongue.   glucose blood (ACCU-CHEK AVIVA PLUS) test strip See admin instructions.   ibuprofen (ADVIL) 200 MG tablet Take 400 mg by mouth.   Lancets MISC See admin instructions.   losartan  (COZAAR ) 100 MG tablet Take 1 tablet by mouth daily.   meloxicam  (MOBIC ) 15 MG tablet Take 15 mg by mouth.   ACCU-CHEK GUIDE TEST test strip SMARTSIG:Strip(s)   Accu-Chek Softclix Lancets lancets SMARTSIG:Lancet Topical   aspirin EC 81 MG tablet Take 81 mg by mouth daily. Swallow whole.   atorvastatin  (LIPITOR) 40 MG tablet Take 40 mg by mouth daily.   Cholecalciferol (VITAMIN D -1000 MAX ST) 25  MCG (1000 UT) tablet Take 1,000 Units by mouth daily.   DULoxetine (CYMBALTA) 60 MG capsule Take 60 mg by mouth every morning.    hydrochlorothiazide (HYDRODIURIL) 25 MG tablet Take 25 mg by mouth daily.   ibuprofen (ADVIL) 200 MG tablet Take 200 mg by mouth every 4 (four) hours as needed.   Lancets Misc. (ACCU-CHEK SOFTCLIX LANCET DEV) KIT See admin instructions.   levothyroxine  (SYNTHROID ) 50 MCG tablet Take 50 mcg by mouth daily before breakfast.   linaclotide (LINZESS) 145 MCG CAPS capsule Take 145 mcg by mouth as needed.   lisinopril (ZESTRIL) 40 MG tablet Take 40 mg by mouth daily.   losartan  (COZAAR ) 100 MG tablet Take 100 mg by mouth daily.   meloxicam  (MOBIC ) 15 MG tablet    sennosides-docusate sodium (SENOKOT-S) 8.6-50 MG tablet Take 1 tablet by mouth daily.   sitaGLIPtin  (JANUVIA ) 100 MG tablet Take 100 mg by mouth daily.   No facility-administered encounter medications on file as of 07/10/2023.    Social History   Tobacco Use   Smoking status: Former    Current packs/day: 0.00    Average packs/day: 0.8 packs/day for 10.0 years (7.5 ttl pk-yrs)    Types: Cigarettes    Start date: 05/23/1975    Quit date: 05/22/1985    Years since  quitting: 38.1   Smokeless tobacco: Never  Substance Use Topics   Alcohol use: No   Drug use: No      Review of Systems  Constitutional:  Negative for diaphoresis, fever, malaise/fatigue and weight loss.  Respiratory:  Negative for cough, shortness of breath and wheezing.   Cardiovascular:  Negative for chest pain, palpitations, orthopnea, claudication, leg swelling and PND.      Objective:     BP (!) 181/74 (BP Location: Right Arm, Patient Position: Standing;Sitting, Cuff Size: Normal)   Pulse 97   Temp 98.5 F (36.9 C) (Oral)   Resp 16   Ht 4' 9.87 (1.47 m)   Wt 162 lb 8 oz (73.7 kg)   SpO2 90%   BMI 34.11 kg/m    Physical Exam Constitutional:      General: Tina Ortega is not in acute distress.    Appearance: Normal appearance.  Tina Ortega is obese.  HENT:     Head: Normocephalic.  Neck:     Vascular: No carotid bruit.   Cardiovascular:     Rate and Rhythm: Normal rate and regular rhythm.     Pulses: Normal pulses.     Comments: 2-3/6 SEM, especially over her Aorta Pulmonary:     Effort: Pulmonary effort is normal.     Breath sounds: Normal breath sounds.  Abdominal:     General: Bowel sounds are normal.     Palpations: Abdomen is soft.   Musculoskeletal:     Cervical back: Neck supple. No tenderness.     Right lower leg: No edema.     Left lower leg: No edema.     Comments: Stiff gait noted   Neurological:     Mental Status: Tina Ortega is alert.      No results found for any visits on 07/10/23.    The ASCVD Risk score (Arnett DK, et al., 2019) failed to calculate for the following reasons:   Cannot find a previous HDL lab   Cannot find a previous total cholesterol lab    Assessment & Plan:  ASCVD (arteriosclerotic cardiovascular disease) Assessment & Plan: Stable.  Cautioned about today's poor BP.  Tina Ortega feels her standing home BP is controlled and declines further assistance for now.  Extended discussion about reducing her fall risk, etc.  Request records from Dr. Jama.  Referred to Dr. Krasowski for an Echo, and possibly additional Cardiac testing as Tina Ortega is at least moderate risk and can't walk much.  Will obtain fasting labs at her next visit.  Trial of Coenzyme Q-10 for possible statin induced myalgias.  Might function better w/o the statin, but obviously I'm not comfortable with stopping it at this time.  Tina Ortega agrees to stop the Mobic , since Tina Ortega prefers to remain on the Advil.  Orders: -     Ambulatory referral to Cardiology  Dyslipidemia  Gait disorder  Cardiac murmur -     Ambulatory referral to Cardiology  Asymptomatic hypertensive urgency    Return in about 2 weeks (around 07/24/2023) for chronic follow-up.    REDDING PONCE NORLEEN FALCON., MD "

## 2023-07-12 ENCOUNTER — Telehealth (HOSPITAL_BASED_OUTPATIENT_CLINIC_OR_DEPARTMENT_OTHER): Payer: Self-pay | Admitting: Family Medicine

## 2023-07-12 NOTE — Telephone Encounter (Signed)
 Copied from CRM [id number]. Topic: Clinical - Medical Advice >> Jul 12, 2023 11:34 AM Zipporah Him wrote: Reason for CRM: Reporting blood pressure readings, states was requested by Dr [place]' nurse: 6/17 ~Morning 162/71,  6/17 ~3:00P M 144/78,  6/18 ~11AM 133/71  patients son doug requests a call to his phone if someone needs to reach out regarding these readings

## 2023-07-20 ENCOUNTER — Telehealth (HOSPITAL_BASED_OUTPATIENT_CLINIC_OR_DEPARTMENT_OTHER): Payer: Self-pay | Admitting: Family Medicine

## 2023-07-20 MED ORDER — SITAGLIPTIN PHOSPHATE 100 MG PO TABS: 100.0000 mg | ORAL_TABLET | Freq: Every day | ORAL | 3 refills | Status: DC

## 2023-07-20 MED ORDER — LEVOTHYROXINE SODIUM 50 MCG PO TABS: 50.0000 ug | ORAL_TABLET | Freq: Every day | ORAL | 3 refills | Status: DC

## 2023-07-20 MED ORDER — ATORVASTATIN CALCIUM 40 MG PO TABS: 40.0000 mg | ORAL_TABLET | Freq: Every day | ORAL | 3 refills | Status: DC

## 2023-07-20 NOTE — Telephone Encounter (Signed)
 Copied from CRM [id number]. Topic: Clinical - Medication Refill >> Jul 20, 2023 12:33 PM Marissa P wrote: Medication:  levothyroxine (SYNTHROID) 50 MCG tablet atorvastatin (LIPITOR) 40 MG tablet sitaGLIPtin (JANUVIA) 100 MG tablet   Has the patient contacted their pharmacy? No (Agent: If no, request that the patient contact the pharmacy for the refill. If patient does not wish to contact the pharmacy document the reason why and proceed with request.) (Agent: If yes, when and what did the pharmacy advise?)  This is the patient's preferred pharmacy:  [facility]   Is this the correct pharmacy for this prescription? Yes If no, delete pharmacy and type the correct one.   Has the prescription been filled recently? No  Is the patient out of the medication? Yes  Has the patient been seen for an appointment in the last year OR does the patient have an upcoming appointment? Yes  Can we respond through MyChart? No  Agent: Please be advised that Rx refills may take up to 3 business days. We ask that you follow-up with your pharmacy.

## 2023-07-20 NOTE — Telephone Encounter (Signed)
 Pharmacy called and quantity corrected.

## 2023-07-26 ENCOUNTER — Ambulatory Visit (INDEPENDENT_AMBULATORY_CARE_PROVIDER_SITE_OTHER): Admitting: Family Medicine

## 2023-07-26 ENCOUNTER — Encounter (HOSPITAL_BASED_OUTPATIENT_CLINIC_OR_DEPARTMENT_OTHER): Payer: Self-pay | Admitting: Family Medicine

## 2023-07-26 VITALS — BP 101/54 | HR 88 | Temp 98.5°F | Resp 16 | Ht 59.06 in | Wt 161.6 lb

## 2023-07-26 DIAGNOSIS — I2510 Atherosclerotic heart disease of native coronary artery without angina pectoris: Secondary | ICD-10-CM

## 2023-07-26 DIAGNOSIS — R5383 Other fatigue: Secondary | ICD-10-CM

## 2023-07-26 DIAGNOSIS — I10 Essential (primary) hypertension: Secondary | ICD-10-CM

## 2023-07-26 DIAGNOSIS — M25562 Pain in left knee: Secondary | ICD-10-CM

## 2023-07-26 DIAGNOSIS — G8929 Other chronic pain: Secondary | ICD-10-CM | POA: Diagnosis not present

## 2023-07-26 DIAGNOSIS — E1165 Type 2 diabetes mellitus with hyperglycemia: Secondary | ICD-10-CM | POA: Diagnosis not present

## 2023-07-26 DIAGNOSIS — E039 Hypothyroidism, unspecified: Secondary | ICD-10-CM | POA: Diagnosis not present

## 2023-07-26 DIAGNOSIS — D510 Vitamin B12 deficiency anemia due to intrinsic factor deficiency: Secondary | ICD-10-CM

## 2023-07-26 DIAGNOSIS — E785 Hyperlipidemia, unspecified: Secondary | ICD-10-CM

## 2023-07-26 HISTORY — DX: Vitamin B12 deficiency anemia due to intrinsic factor deficiency: D51.0

## 2023-07-26 HISTORY — DX: Type 2 diabetes mellitus with hyperglycemia: E11.65

## 2023-07-26 HISTORY — DX: Pain in left knee: M25.562

## 2023-07-26 HISTORY — DX: Hypothyroidism, unspecified: E03.9

## 2023-07-26 MED ORDER — LOSARTAN POTASSIUM 100 MG PO TABS: 100.0000 mg | ORAL_TABLET | Freq: Every day | ORAL | 1 refills | Status: AC

## 2023-07-26 NOTE — Progress Notes (Signed)
 Established Patient Office Visit  Subjective   Patient ID: Tina Ortega, female    DOB: 08-22-1945  Age: 78 y.o. MRN: [id number]  Chief Complaint  Patient presents with   Follow-up    Follow-up visit    F/u as above.  Please see last note for details.  We are still waiting on records from Dr. Jama.  She was referred to Dr. Krasowski for an Echo, etc.  I'm sorry to hear that she hasn't been checking her standing home BP as directed.  She also hasn't obtained the CoQ-10 as advised.  She is frustrated by worsening left knee arthritis, but quickly recoils at the thought of me giving her an injection today.  I was unsuccessful in reassuring her that it would only involve mild discomfort.    Past Medical History:  Diagnosis Date   Diabetes mellitus type 2 with complications (HCC)    Diabetic neuropathy (HCC)    GERD (gastroesophageal reflux disease)    H/O hiatal hernia    History of stroke    (Hemorrhagic), known to Neuro   Hyperlipidemia    Hypertension    white coat syndrome   Lumbar radiculopathy    known to Neuro   Multifactorial gait disorder    known to [facility]   Osteoarthritis    Sleep apnea    borderline no cpap needed    Outpatient Encounter Medications as of 07/26/2023  Medication Sig   ACCU-CHEK GUIDE TEST test strip SMARTSIG:Strip(s)   Accu-Chek Softclix Lancets lancets SMARTSIG:Lancet Topical   acetaminophen  (TYLENOL ) 500 MG tablet Take 500 mg by mouth.   aspirin EC 81 MG tablet Take 81 mg by mouth daily. Swallow whole.   atorvastatin  (LIPITOR) 40 MG tablet Take 1 tablet (40 mg total) by mouth daily.   Cholecalciferol (VITAMIN D -1000 MAX ST) 25 MCG (1000 UT) tablet Take 1,000 Units by mouth daily.   Cyanocobalamin (B-12) 5000 MCG SUBL Place 5,000 mcg under the tongue.   DULoxetine (CYMBALTA) 60 MG capsule Take 60 mg by mouth every morning.    glucose blood (ACCU-CHEK AVIVA PLUS) test strip See admin instructions.   hydrochlorothiazide (HYDRODIURIL)  25 MG tablet Take 25 mg by mouth daily.   Lancets Misc. (ACCU-CHEK SOFTCLIX LANCET DEV) KIT See admin instructions.   Lancets MISC See admin instructions.   levothyroxine  (SYNTHROID ) 50 MCG tablet Take 1 tablet (50 mcg total) by mouth daily before breakfast.   linaclotide (LINZESS) 145 MCG CAPS capsule Take 145 mcg by mouth as needed.   meloxicam (MOBIC) 15 MG tablet Take 15 mg by mouth.   sennosides-docusate sodium (SENOKOT-S) 8.6-50 MG tablet Take 1 tablet by mouth daily.   sitaGLIPtin  (JANUVIA ) 100 MG tablet Take 1 tablet (100 mg total) by mouth daily.   [DISCONTINUED] ibuprofen (ADVIL) 200 MG tablet Take 200 mg by mouth every 4 (four) hours as needed.   [DISCONTINUED] ibuprofen (ADVIL) 200 MG tablet Take 400 mg by mouth.   [DISCONTINUED] lisinopril (ZESTRIL) 40 MG tablet Take 40 mg by mouth daily.   [DISCONTINUED] losartan (COZAAR) 100 MG tablet Take 100 mg by mouth daily.   [DISCONTINUED] losartan (COZAAR) 100 MG tablet Take 1 tablet by mouth daily.   [DISCONTINUED] meloxicam (MOBIC) 15 MG tablet    losartan (COZAAR) 100 MG tablet Take 1 tablet (100 mg total) by mouth daily.   No facility-administered encounter medications on file as of 07/26/2023.    Social History   Tobacco Use   Smoking status: Former  Current packs/day: 0.00    Average packs/day: 0.8 packs/day for 10.0 years (7.5 ttl pk-yrs)    Types: Cigarettes    Start date: 05/23/1975    Quit date: 05/22/1985    Years since quitting: 38.2   Smokeless tobacco: Never  Substance Use Topics   Alcohol use: No   Drug use: No      Review of Systems  Constitutional:  Negative for diaphoresis, fever, malaise/fatigue and weight loss.  Respiratory:  Negative for cough, shortness of breath and wheezing.   Cardiovascular:  Negative for chest pain, palpitations, orthopnea, claudication, leg swelling and PND.      Objective:     BP (!) 101/54 (BP Location: Right Arm, Patient Position: Standing, Cuff Size: Normal)   Pulse 88    Temp 98.5 F (36.9 C) (Oral)   Resp 16   Ht 4' 11.06 (1.5 m)   Wt 161 lb 9.6 oz (73.3 kg)   SpO2 (!) 88% Comment: Pt. stated that O2 is normally on the low side.  BMI 32.58 kg/m    Physical Exam Constitutional:      General: She is not in acute distress.    Appearance: Normal appearance.     Comments: Stiff gait with walker needed.  HENT:     Head: Normocephalic.  Neck:     Vascular: No carotid bruit.  Cardiovascular:     Rate and Rhythm: Normal rate and regular rhythm.     Pulses: Normal pulses.     Heart sounds: Normal heart sounds.  Pulmonary:     Effort: Pulmonary effort is normal.     Breath sounds: Normal breath sounds.  Abdominal:     General: Bowel sounds are normal.     Palpations: Abdomen is soft.  Musculoskeletal:     Cervical back: Neck supple. No tenderness.     Right lower leg: No edema.     Left lower leg: No edema.     Comments: Left knee with mod-severe OA changes.  No instability.  Neurological:     Mental Status: She is alert.      No results found for any visits on 07/26/23.    The ASCVD Risk score (Arnett DK, et al., 2019) failed to calculate for the following reasons:   Cannot find a previous HDL lab   Cannot find a previous total cholesterol lab    Assessment & Plan:  Dyslipidemia Assessment & Plan: Stable.  Awaiting appt with Dr. Krasowski.  Again advised a trial of Coenzyme Q-10 and her granddaughter will assist with this.  I offered to inject her arthritic left knee today and she declined.  Again encouraged standing home BP monitoring and alert us  if not at goal.  Orders: -     Lipid panel  ASCVD (arteriosclerotic cardiovascular disease)  Uncontrolled type 2 diabetes mellitus with hyperglycemia (HCC) -     Hemoglobin A1c -     CBC with Differential/Platelet -     Comprehensive metabolic panel with GFR  Fatigue, unspecified type  Acquired hypothyroidism -     TSH  Pernicious anemia -     Vitamin B12  Chronic pain of  left knee  Essential hypertension, benign -     Losartan Potassium; Take 1 tablet (100 mg total) by mouth daily.  Dispense: 90 tablet; Refill: 1    Return in about 4 weeks (around 08/23/2023) for chronic follow-up.    REDDING PONCE NORLEEN FALCON., MD

## 2023-07-26 NOTE — Assessment & Plan Note (Addendum)
 Stable.  Awaiting appt with Dr. Krasowski.  Again advised a trial of Coenzyme Q-10 and her granddaughter will assist with this.  I offered to inject her arthritic left knee today and she declined.  Again encouraged standing home BP monitoring and alert us  if not at goal.

## 2023-07-27 ENCOUNTER — Ambulatory Visit (HOSPITAL_BASED_OUTPATIENT_CLINIC_OR_DEPARTMENT_OTHER): Payer: Self-pay | Admitting: Family Medicine

## 2023-07-27 LAB — VITAMIN B12: Vitamin B-12: 457 pg/mL (ref 232–1245)

## 2023-07-27 LAB — CBC WITH DIFFERENTIAL/PLATELET
Basophils Absolute: 0.1 10*3/uL (ref 0.0–0.2)
Basos: 1 %
EOS (ABSOLUTE): 0.3 10*3/uL (ref 0.0–0.4)
Eos: 4 %
Hematocrit: 41.8 % (ref 34.0–46.6)
Hemoglobin: 13.8 g/dL (ref 11.1–15.9)
Immature Grans (Abs): 0 10*3/uL (ref 0.0–0.1)
Immature Granulocytes: 0 %
Lymphocytes Absolute: 2 10*3/uL (ref 0.7–3.1)
Lymphs: 29 %
MCH: 31 pg (ref 26.6–33.0)
MCHC: 33 g/dL (ref 31.5–35.7)
MCV: 94 fL (ref 79–97)
Monocytes Absolute: 0.6 10*3/uL (ref 0.1–0.9)
Monocytes: 9 %
Neutrophils Absolute: 3.9 10*3/uL (ref 1.4–7.0)
Neutrophils: 57 %
Platelets: 252 10*3/uL (ref 150–450)
RBC: 4.45 x10E6/uL (ref 3.77–5.28)
RDW: 12.4 % (ref 11.7–15.4)
WBC: 6.8 10*3/uL (ref 3.4–10.8)

## 2023-07-27 LAB — COMPREHENSIVE METABOLIC PANEL WITH GFR
ALT: 20 IU/L (ref 0–32)
AST: 22 IU/L (ref 0–40)
Albumin: 4.4 g/dL (ref 3.8–4.8)
Alkaline Phosphatase: 115 IU/L (ref 44–121)
BUN/Creatinine Ratio: 37 — ABNORMAL HIGH (ref 12–28)
BUN: 28 mg/dL — ABNORMAL HIGH (ref 8–27)
Bilirubin Total: 0.9 mg/dL (ref 0.0–1.2)
CO2: 25 mmol/L (ref 20–29)
Calcium: 10 mg/dL (ref 8.7–10.3)
Chloride: 101 mmol/L (ref 96–106)
Creatinine, Ser: 0.76 mg/dL (ref 0.57–1.00)
Globulin, Total: 2.6 g/dL (ref 1.5–4.5)
Glucose: 121 mg/dL — ABNORMAL HIGH (ref 70–99)
Potassium: 5.1 mmol/L (ref 3.5–5.2)
Sodium: 140 mmol/L (ref 134–144)
Total Protein: 7 g/dL (ref 6.0–8.5)
eGFR: 80 mL/min/{1.73_m2} (ref >59)

## 2023-07-27 LAB — LIPID PANEL
Chol/HDL Ratio: 3 ratio (ref 0.0–4.4)
Cholesterol, Total: 132 mg/dL (ref 100–199)
HDL: 44 mg/dL (ref >39)
LDL Chol Calc (NIH): 71 mg/dL (ref 0–99)
Triglycerides: 86 mg/dL (ref 0–149)
VLDL Cholesterol Cal: 17 mg/dL (ref 5–40)

## 2023-07-27 LAB — HEMOGLOBIN A1C
Est. average glucose Bld gHb Est-mCnc: 140 mg/dL
Hgb A1c MFr Bld: 6.5 % — ABNORMAL HIGH (ref 4.8–5.6)

## 2023-07-27 LAB — TSH: TSH: 2.41 u[IU]/mL (ref 0.450–4.500)

## 2023-08-10 ENCOUNTER — Other Ambulatory Visit (HOSPITAL_BASED_OUTPATIENT_CLINIC_OR_DEPARTMENT_OTHER): Payer: Self-pay | Admitting: Family Medicine

## 2023-08-21 ENCOUNTER — Ambulatory Visit: Attending: Cardiology | Admitting: Cardiology

## 2023-08-21 ENCOUNTER — Telehealth (HOSPITAL_BASED_OUTPATIENT_CLINIC_OR_DEPARTMENT_OTHER): Payer: Self-pay | Admitting: Family Medicine

## 2023-08-21 VITALS — BP 136/76 | HR 97 | Ht <= 58 in | Wt 162.6 lb

## 2023-08-21 DIAGNOSIS — E785 Hyperlipidemia, unspecified: Secondary | ICD-10-CM | POA: Diagnosis not present

## 2023-08-21 DIAGNOSIS — Z9884 Bariatric surgery status: Secondary | ICD-10-CM | POA: Diagnosis not present

## 2023-08-21 DIAGNOSIS — R0609 Other forms of dyspnea: Secondary | ICD-10-CM | POA: Diagnosis not present

## 2023-08-21 DIAGNOSIS — G4733 Obstructive sleep apnea (adult) (pediatric): Secondary | ICD-10-CM

## 2023-08-21 DIAGNOSIS — I10 Essential (primary) hypertension: Secondary | ICD-10-CM | POA: Diagnosis not present

## 2023-08-21 DIAGNOSIS — R269 Unspecified abnormalities of gait and mobility: Secondary | ICD-10-CM | POA: Diagnosis not present

## 2023-08-21 DIAGNOSIS — R011 Cardiac murmur, unspecified: Secondary | ICD-10-CM

## 2023-08-21 NOTE — Progress Notes (Unsigned)
 Cardiology Consultation:    Date:  08/21/2023   ID:  MARSHAY Ortega, DOB 1945-05-16, MRN [id number]  PCP:  Tina Norleen PHEBE PONCE, MD  Cardiologist:  Tina Fitch, MD   Referring MD: Tina Norleen PHEBE PONCE, MD   Chief Complaint  Patient presents with   Heart Murmur    History of Present Illness:    Tina Ortega is a 78 y.o. female who is being seen today for the evaluation of heart murmur at the request of Tina Norleen PHEBE PONCE, MD. past medical history significant for hemorrhagic stroke, essential hypertension, dyslipidemia, diabetes, diabetic neuropathy, she was referred to us  because on the physical exam she was discovered to have heart murmur.  She said this is new discovery for her she never knew about heart murmur.  Her ability to exercise is limited that is secondary to her gait issue.  She fell down many times but no syncope.  She denies have any palpitations there is no chest pain tightness squeezing pressure burning chest.  She does not do much majority of time she simply sits in the chair.  She has never had any heart problem no myocardial infarction no congestive heart failure.  She does not exercise on the regular basis she is not on any special diet, family history significant for coronary artery disease but not premature.  Past Medical History:  Diagnosis Date   Diabetes mellitus type 2 with complications (HCC)    Diabetic neuropathy (HCC)    GERD (gastroesophageal reflux disease)    H/O hiatal hernia    History of stroke    (Hemorrhagic), known to Neuro   Hyperlipidemia    Hypertension    white coat syndrome   Lumbar radiculopathy    known to Neuro   Multifactorial gait disorder    known to [facility]   Osteoarthritis    Sleep apnea    borderline no cpap needed    Past Surgical History:  Procedure Laterality Date   APPENDECTOMY  1965   carpal tunnel repair Right yrs ago   CESAREAN SECTION  1975, 1977   LAPAROSCOPIC GASTRIC SLEEVE RESECTION N/A  10/01/2013   Procedure: LAPAROSCOPIC GASTRIC SLEEVE RESECTION WITH ENDOSCOPY, HIATAL HERNIA REPAIR ;  Surgeon: Adina Lunger, MD;  Location: [facility];  Service: General;  Laterality: N/A;   OVARIAN CYST SURGERY  1965   TONSILLECTOMY  age 28   and adenoids    Current Medications: Current Meds  Medication Sig   ACCU-CHEK GUIDE TEST test strip SMARTSIG:Strip(s)   Accu-Chek Softclix Lancets lancets SMARTSIG:Lancet Topical   acetaminophen  (TYLENOL ) 500 MG tablet Take 500 mg by mouth.   aspirin EC 81 MG tablet Take 81 mg by mouth daily. Swallow whole.   atorvastatin  (LIPITOR) 40 MG tablet Take 1 tablet by mouth once daily   Cholecalciferol (VITAMIN D -1000 MAX ST) 25 MCG (1000 UT) tablet Take 1,000 Units by mouth daily.   Cyanocobalamin (B-12) 5000 MCG SUBL Place 5,000 mcg under the tongue.   DULoxetine (CYMBALTA) 60 MG capsule Take 60 mg by mouth every morning.    glucose blood (ACCU-CHEK AVIVA PLUS) test strip See admin instructions.   hydrochlorothiazide (HYDRODIURIL) 25 MG tablet Take 25 mg by mouth daily.   JANUVIA  100 MG tablet Take 1 tablet by mouth once daily   Lancets Misc. (ACCU-CHEK SOFTCLIX LANCET DEV) KIT See admin instructions.   Lancets MISC See admin instructions.   levothyroxine  (SYNTHROID ) 50 MCG tablet TAKE 1 TABLET BY MOUTH ONCE DAILY BEFORE  BREAKFAST   linaclotide (LINZESS) 145 MCG CAPS capsule Take 145 mcg by mouth as needed.   losartan  (COZAAR ) 100 MG tablet Take 1 tablet (100 mg total) by mouth daily.   meloxicam (MOBIC) 15 MG tablet Take 15 mg by mouth.   sennosides-docusate sodium (SENOKOT-S) 8.6-50 MG tablet Take 1 tablet by mouth daily.     Allergies:   Penicillins   Social History   Socioeconomic History   Marital status: Divorced    Spouse name: Not on file   Number of children: Not on file   Years of education: Not on file   Highest education level: Not on file  Occupational History   Occupation: Retired.  Insists on living alone.  Son is nearby.  Tobacco  Use   Smoking status: Former    Current packs/day: 0.00    Average packs/day: 0.8 packs/day for 10.0 years (7.5 ttl pk-yrs)    Types: Cigarettes    Start date: 05/23/1975    Quit date: 05/22/1985    Years since quitting: 38.2   Smokeless tobacco: Never  Substance and Sexual Activity   Alcohol use: No   Drug use: No   Sexual activity: Not on file  Other Topics Concern   Not on file  Social History Narrative   Not on file   Social Drivers of Health   Financial Resource Strain: Low Risk  (07/10/2023)   Overall Financial Resource Strain (CARDIA)    Difficulty of Paying Living Expenses: Not very hard  Food Insecurity: No Food Insecurity (07/10/2023)   Hunger Vital Sign    Worried About Running Out of Food in the Last Year: Never true    Ran Out of Food in the Last Year: Never true  Transportation Needs: No Transportation Needs (07/10/2023)   PRAPARE - Administrator, Civil Service (Medical): No    Lack of Transportation (Non-Medical): No  Physical Activity: Inactive (07/10/2023)   Exercise Vital Sign    Days of Exercise per Week: 0 days    Minutes of Exercise per Session: 0 min  Stress: Stress Concern Present (07/10/2023)   [employer] of Occupational Health - Occupational Stress Questionnaire    Feeling of Stress: Rather much  Social Connections: Not on file     Family History: The patient's family history includes Arthritis in her father and sister; Diabetes in her mother; Heart disease in her mother. ROS:   Please see the history of present illness.    All 14 point review of systems negative except as described per history of present illness.  EKGs/Labs/Other Studies Reviewed:    The following studies were reviewed today:   EKG:  EKG Interpretation Date/Time:  Monday August 21 2023 10:48:34 EDT Ventricular Rate:  97 PR Interval:  164 QRS Duration:  112 QT Interval:  340 QTC Calculation: 431 R Axis:   4  Text Interpretation: Normal sinus rhythm  Minimal voltage criteria for LVH, may be normal variant Borderline ECG When compared with ECG of 24-Jun-2013 10:13, Vent. rate has increased BY  34 BPM Minimal criteria for Anterior infarct are no longer Present Confirmed by Tina Ortega [phone]) on 08/21/2023 10:52:04 AM    Recent Labs: 07/26/2023: ALT 20; BUN 28; Creatinine, Ser 0.76; Hemoglobin 13.8; Platelets 252; Potassium 5.1; Sodium 140; TSH 2.410  Recent Lipid Panel    Component Value Date/Time   CHOL 132 07/26/2023 0912   TRIG 86 07/26/2023 0912   HDL 44 07/26/2023 0912   CHOLHDL 3.0 07/26/2023 0912  CHOLHDL 5.0 06/19/2013 1717   VLDL 48 (H) 06/19/2013 1717   LDLCALC 71 07/26/2023 0912    Physical Exam:    VS:  BP 136/76 (BP Location: Left Arm, Patient Position: Sitting)   Pulse 97   Ht 4' 9 (1.448 m)   Wt 162 lb 9.6 oz (73.8 kg)   SpO2 93%   BMI 35.19 kg/m     Wt Readings from Last 3 Encounters:  08/21/23 162 lb 9.6 oz (73.8 kg)  07/26/23 161 lb 9.6 oz (73.3 kg)  07/10/23 162 lb 8 oz (73.7 kg)     GEN:  Well nourished, well developed in no acute distress HEENT: Normal NECK: No JVD; No carotid bruits LYMPHATICS: No lymphadenopathy CARDIAC: RRR, systolic ejection murmur grade 2/6 to 3/6 posterior right upper portion of the sternum with radiation towards the neck, S2 is still present, no rubs, no gallops RESPIRATORY:  Clear to auscultation without rales, wheezing or rhonchi  ABDOMEN: Soft, non-tender, non-distended MUSCULOSKELETAL:  No edema; No deformity  SKIN: Warm and dry NEUROLOGIC:  Alert and oriented x 3 PSYCHIATRIC:  Normal affect   ASSESSMENT:    1. Essential hypertension, benign   2. Dyspnea on exertion   3. Cardiac murmur   4. Status post gastric bypass for obesity   5. Gait disorder   6. Dyslipidemia   7. OSA (obstructive sleep apnea)    PLAN:    In order of problems listed above:  Heart murmur I suspect aortic stenosis however S2 is still present so I do not think it is critical, mid  peaking  will get echocardiogram to clarify that, I suspect also some degree of mitral regurgitation.  Again echocardiogram will be done to clarify that. Essential hypertension blood pressure well-controlled continue present management. Dyslipidemia I did review K PN dated from 7-25 show LDL 71 HDL 44 excellent control with Lipitor 40 which I will continue. She is not on antiplatelet therapy as suspect that is secondary to history of intracranial bleed. Obstructive sleep apnea followed by internal medicine team   Medication Adjustments/Labs and Tests Ordered: Current medicines are reviewed at length with the patient today.  Concerns regarding medicines are outlined above.  Orders Placed This Encounter  Procedures   EKG 12-Lead   ECHOCARDIOGRAM COMPLETE   No orders of the defined types were placed in this encounter.   Signed, Tina DOROTHA Fitch, MD, [facility]. 08/21/2023 11:05 AM    [facility]

## 2023-08-21 NOTE — Telephone Encounter (Signed)
 Copied from CRM [id number]. Topic: Appointments - Scheduling Inquiry for Clinic >> Aug 21, 2023 12:05 PM Jasmin G wrote: Reason for CRM: Pt's son called to see if pt could get a cortisol shot on her knee, it was recommended by Dr last time, he wanted to see if it would be possible to get it on her next appt (July 30th). Please call Mr.Jason back at [phone] to let him know if it;s possible

## 2023-08-21 NOTE — Patient Instructions (Addendum)
 Medication Instructions:  Your physician recommends that you continue on your current medications as directed. Please refer to the Current Medication list given to you today.  *If you need a refill on your cardiac medications before your next appointment, please call your pharmacy*   Lab Work: None Ordered If you have labs (blood work) drawn today and your tests are completely normal, you will receive your results only by: MyChart Message (if you have MyChart) OR A paper copy in the mail If you have any lab test that is abnormal or we need to change your treatment, we will call you to review the results.   Testing/Procedures: Your physician has requested that you have an echocardiogram. Echocardiography is a painless test that uses sound waves to create images of your heart. It provides your doctor with information about the size and shape of your heart and how well your heart's chambers and valves are working. This procedure takes approximately one hour. There are no restrictions for this procedure. Please do NOT wear cologne, perfume, aftershave, or lotions (deodorant is allowed). Please arrive 15 minutes prior to your appointment time.  Please note: We ask at that you not bring children with you during ultrasound (echo/ vascular) testing. Due to room size and safety concerns, children are not allowed in the ultrasound rooms during exams. Our front office staff cannot provide observation of children in our lobby area while testing is being conducted. An adult accompanying a patient to their appointment will only be allowed in the ultrasound room at the discretion of the ultrasound technician under special circumstances. We apologize for any inconvenience.    Follow-Up: At [facility], you and your health needs are our priority.  As part of our continuing mission to provide you with exceptional heart care, we have created designated Provider Care Teams.  These Care Teams include your  primary Cardiologist (physician) and Advanced Practice Providers (APPs -  Physician Assistants and Nurse Practitioners) who all work together to provide you with the care you need, when you need it.  We recommend signing up for the patient portal called "MyChart".  Sign up information is provided on this After Visit Summary.  MyChart is used to connect with patients for Virtual Visits (Telemedicine).  Patients are able to view lab/test results, encounter notes, upcoming appointments, etc.  Non-urgent messages can be sent to your provider as well.   To learn more about what you can do with MyChart, go to [email or web address].    Your next appointment:   2 month(s)  The format for your next appointment:   In Person  Provider:   Gypsy Balsam, MD    Other Instructions NA

## 2023-08-22 ENCOUNTER — Telehealth (HOSPITAL_BASED_OUTPATIENT_CLINIC_OR_DEPARTMENT_OTHER): Payer: Self-pay | Admitting: Family Medicine

## 2023-08-22 NOTE — Telephone Encounter (Signed)
 Copied from CRM [id number]. Topic: Appointments - Scheduling Inquiry for Clinic >> Aug 21, 2023 12:05 PM Jasmin G wrote: Reason for CRM: Pt's son called to see if pt could get a cortisol shot on her knee, it was recommended by Dr last time, he wanted to see if it would be possible to get it on her next appt (July 30th). Please call Mr.Jason back at [phone] to let him know if it;s possible >> Aug 22, 2023 10:12 AM Chiquita SQUIBB wrote: Patients son is calling in asking if he needs to add another appointment for tomorrow for this shot, or if it is just being added into the appointment time she already has? If it just being added to the appointment, no need to call back.

## 2023-08-23 ENCOUNTER — Encounter (HOSPITAL_BASED_OUTPATIENT_CLINIC_OR_DEPARTMENT_OTHER): Payer: Self-pay | Admitting: Family Medicine

## 2023-08-23 ENCOUNTER — Encounter (HOSPITAL_BASED_OUTPATIENT_CLINIC_OR_DEPARTMENT_OTHER): Payer: Self-pay

## 2023-08-23 ENCOUNTER — Ambulatory Visit (HOSPITAL_BASED_OUTPATIENT_CLINIC_OR_DEPARTMENT_OTHER): Admitting: Family Medicine

## 2023-08-23 VITALS — BP 138/79 | HR 97 | Temp 97.7°F | Resp 17 | Wt 164.0 lb

## 2023-08-23 DIAGNOSIS — R269 Unspecified abnormalities of gait and mobility: Secondary | ICD-10-CM

## 2023-08-23 DIAGNOSIS — G8929 Other chronic pain: Secondary | ICD-10-CM | POA: Diagnosis not present

## 2023-08-23 DIAGNOSIS — M25562 Pain in left knee: Secondary | ICD-10-CM

## 2023-08-23 NOTE — Progress Notes (Signed)
 Established Patient Office Visit  Subjective   Patient ID: Tina Ortega, female    DOB: 1945/02/08  Age: 78 y.o. MRN: [id number]  Chief Complaint  Patient presents with   Follow-up    Follow-up visit     F/u as above.  Now agreeable to a knee injection.  Recent labs were satisfactory.  She and her son have no other acute concerns today.    Past Medical History:  Diagnosis Date   Diabetes mellitus type 2 with complications (HCC)    Diabetic neuropathy (HCC)    GERD (gastroesophageal reflux disease)    H/O hiatal hernia    History of stroke    (Hemorrhagic), known to Neuro   Hyperlipidemia    Hypertension    white coat syndrome   Lumbar radiculopathy    known to Neuro   Multifactorial gait disorder    known to [facility]   Osteoarthritis    Sleep apnea    borderline no cpap needed    Outpatient Encounter Medications as of 08/23/2023  Medication Sig   ACCU-CHEK GUIDE TEST test strip SMARTSIG:Strip(s)   Accu-Chek Softclix Lancets lancets SMARTSIG:Lancet Topical   acetaminophen  (TYLENOL ) 500 MG tablet Take 500 mg by mouth.   aspirin EC 81 MG tablet Take 81 mg by mouth daily. Swallow whole.   atorvastatin  (LIPITOR) 40 MG tablet Take 1 tablet by mouth once daily   Cholecalciferol (VITAMIN D -1000 MAX ST) 25 MCG (1000 UT) tablet Take 1,000 Units by mouth daily.   Cyanocobalamin (B-12) 5000 MCG SUBL Place 5,000 mcg under the tongue.   DULoxetine (CYMBALTA) 60 MG capsule Take 60 mg by mouth every morning.    glucose blood (ACCU-CHEK AVIVA PLUS) test strip See admin instructions.   hydrochlorothiazide (HYDRODIURIL) 25 MG tablet Take 25 mg by mouth daily.   JANUVIA  100 MG tablet Take 1 tablet by mouth once daily   Lancets Misc. (ACCU-CHEK SOFTCLIX LANCET DEV) KIT See admin instructions.   Lancets MISC See admin instructions.   levothyroxine  (SYNTHROID ) 50 MCG tablet TAKE 1 TABLET BY MOUTH ONCE DAILY BEFORE BREAKFAST   linaclotide (LINZESS) 145 MCG CAPS capsule Take  145 mcg by mouth as needed.   losartan  (COZAAR ) 100 MG tablet Take 1 tablet (100 mg total) by mouth daily.   meloxicam (MOBIC) 15 MG tablet Take 15 mg by mouth.   sennosides-docusate sodium (SENOKOT-S) 8.6-50 MG tablet Take 1 tablet by mouth daily.   No facility-administered encounter medications on file as of 08/23/2023.    Social History   Tobacco Use   Smoking status: Former    Current packs/day: 0.00    Average packs/day: 0.8 packs/day for 10.0 years (7.5 ttl pk-yrs)    Types: Cigarettes    Start date: 05/23/1975    Quit date: 05/22/1985    Years since quitting: 38.2   Smokeless tobacco: Never  Substance Use Topics   Alcohol use: No   Drug use: No      ROS    Objective:     BP 138/79 (BP Location: Right Arm, Patient Position: Standing, Cuff Size: Normal)   Pulse 97   Temp 97.7 F (36.5 C) (Oral)   Resp 17   Wt 164 lb (74.4 kg)   SpO2 (!) 89%   BMI 35.49 kg/m    Physical Exam Constitutional:      General: She is not in acute distress.    Appearance: Normal appearance.  HENT:     Head: Normocephalic.  Neck:  Vascular: No carotid bruit.  Cardiovascular:     Rate and Rhythm: Normal rate and regular rhythm.     Pulses: Normal pulses.     Heart sounds: Normal heart sounds.  Pulmonary:     Effort: Pulmonary effort is normal.     Breath sounds: Normal breath sounds.  Abdominal:     General: Bowel sounds are normal.     Palpations: Abdomen is soft.  Musculoskeletal:     Cervical back: Neck supple. No tenderness.     Right lower leg: No edema.     Left lower leg: No edema.     Comments: Left knee with obvious OA changes.  No instability.  Neurological:     Mental Status: She is alert.      No results found for any visits on 08/23/23.    The 10-year ASCVD risk score (Arnett DK, et al., 2019) is: 52%    Assessment & Plan:   Chronic pain of left knee Assessment & Plan: Consent obtained for injection.  Steriley prepped.  Anesth with Ethyl  chloride spray.  Attempted injection but she didn't tolerate even a skin prick at all.  I therefore discarded the needle and had her knee cleaned and bandaged.  Will defer this injection to Ortho given her pain intolerance.  Orders: -     Ambulatory referral to Orthopedic Surgery  Gait disorder  I personally spent a total of 30 minutes in the care of the patient today including counseling and educating, placing orders, documenting clinical information in the EHR, and communicating results.    Return in about 4 weeks (around 09/20/2023) for chronic follow-up.    REDDING PONCE NORLEEN FALCON., MD

## 2023-08-23 NOTE — Assessment & Plan Note (Addendum)
 Consent obtained for injection.  Steriley prepped.  Anesth with Ethyl chloride spray.  Attempted injection but she didn't tolerate even a skin prick at all.  I therefore discarded the needle and had her knee cleaned and bandaged.  Will defer this injection to Ortho given her pain intolerance.

## 2023-08-28 DIAGNOSIS — M1711 Unilateral primary osteoarthritis, right knee: Secondary | ICD-10-CM | POA: Diagnosis not present

## 2023-08-28 DIAGNOSIS — G8929 Other chronic pain: Secondary | ICD-10-CM | POA: Diagnosis not present

## 2023-08-28 DIAGNOSIS — M25562 Pain in left knee: Secondary | ICD-10-CM | POA: Diagnosis not present

## 2023-09-07 ENCOUNTER — Other Ambulatory Visit

## 2023-09-11 DIAGNOSIS — G8929 Other chronic pain: Secondary | ICD-10-CM | POA: Diagnosis not present

## 2023-09-11 DIAGNOSIS — M25561 Pain in right knee: Secondary | ICD-10-CM | POA: Diagnosis not present

## 2023-09-24 ENCOUNTER — Other Ambulatory Visit (HOSPITAL_BASED_OUTPATIENT_CLINIC_OR_DEPARTMENT_OTHER): Payer: Self-pay | Admitting: Family Medicine

## 2023-09-28 ENCOUNTER — Telehealth (HOSPITAL_BASED_OUTPATIENT_CLINIC_OR_DEPARTMENT_OTHER): Payer: Self-pay | Admitting: *Deleted

## 2023-10-02 ENCOUNTER — Ambulatory Visit (INDEPENDENT_AMBULATORY_CARE_PROVIDER_SITE_OTHER): Admitting: Family Medicine

## 2023-10-02 ENCOUNTER — Encounter (HOSPITAL_BASED_OUTPATIENT_CLINIC_OR_DEPARTMENT_OTHER): Payer: Self-pay | Admitting: Family Medicine

## 2023-10-02 VITALS — BP 116/74 | HR 110 | Temp 98.7°F | Resp 16 | Wt 158.3 lb

## 2023-10-02 DIAGNOSIS — R269 Unspecified abnormalities of gait and mobility: Secondary | ICD-10-CM | POA: Diagnosis not present

## 2023-10-02 DIAGNOSIS — R809 Proteinuria, unspecified: Secondary | ICD-10-CM | POA: Diagnosis not present

## 2023-10-02 DIAGNOSIS — E785 Hyperlipidemia, unspecified: Secondary | ICD-10-CM | POA: Diagnosis not present

## 2023-10-02 DIAGNOSIS — E1129 Type 2 diabetes mellitus with other diabetic kidney complication: Secondary | ICD-10-CM

## 2023-10-02 DIAGNOSIS — M1991 Primary osteoarthritis, unspecified site: Secondary | ICD-10-CM

## 2023-10-02 MED ORDER — MELOXICAM 15 MG PO TABS: 15.0000 mg | ORAL_TABLET | Freq: Every day | ORAL | 1 refills | Status: AC

## 2023-10-02 MED ORDER — SITAGLIPTIN PHOSPHATE 100 MG PO TABS: 100.0000 mg | ORAL_TABLET | Freq: Every day | ORAL | 1 refills | Status: AC

## 2023-10-02 NOTE — Progress Notes (Unsigned)
 Established Patient Office Visit  Subjective   Patient ID: Tina Ortega, female    DOB: July 29, 1945  Age: 78 y.o. MRN: [id number]  Chief Complaint  Patient presents with   Medication Refill    Need refill on J   Medical Management of Chronic Issues    Ned refill on Januvia  100mg , Meloxicam  15mg  and Doxycycline Hyclate 100mg    Well Child    F/u as above.  Medication Refill Pertinent negatives include no chest pain, coughing, diaphoresis or fever.    Past Medical History:  Diagnosis Date   Diabetes mellitus type 2 with complications (HCC)    Diabetic neuropathy (HCC)    GERD (gastroesophageal reflux disease)    H/O hiatal hernia    History of stroke    (Hemorrhagic), known to Neuro   Hyperlipidemia    Hypertension    white coat syndrome   Lumbar radiculopathy    known to Neuro   Multifactorial gait disorder    known to [facility]   Osteoarthritis    Knees f/by Dr. Erica   Sleep apnea    borderline no cpap needed    Outpatient Encounter Medications as of 10/02/2023  Medication Sig   ACCU-CHEK GUIDE TEST test strip SMARTSIG:Strip(s)   Accu-Chek Softclix Lancets lancets SMARTSIG:Lancet Topical   acetaminophen  (TYLENOL ) 500 MG tablet Take 500 mg by mouth.   aspirin EC 81 MG tablet Take 81 mg by mouth daily. Swallow whole.   atorvastatin  (LIPITOR) 40 MG tablet Take 1 tablet by mouth once daily   Cholecalciferol (VITAMIN D -1000 MAX ST) 25 MCG (1000 UT) tablet Take 1,000 Units by mouth daily.   Cyanocobalamin (B-12) 5000 MCG SUBL Place 5,000 mcg under the tongue.   doxycycline (VIBRAMYCIN) 100 MG capsule Take 100 mg by mouth 2 (two) times daily.   DULoxetine (CYMBALTA) 60 MG capsule Take 60 mg by mouth every morning.    glucose blood (ACCU-CHEK AVIVA PLUS) test strip See admin instructions.   hydrochlorothiazide (HYDRODIURIL) 25 MG tablet Take 25 mg by mouth daily.   Lancets Misc. (ACCU-CHEK SOFTCLIX LANCET DEV) KIT See admin instructions.   Lancets MISC See  admin instructions.   levothyroxine  (SYNTHROID ) 50 MCG tablet TAKE 1 TABLET BY MOUTH ONCE DAILY BEFORE BREAKFAST   linaclotide (LINZESS) 145 MCG CAPS capsule Take 145 mcg by mouth as needed.   losartan  (COZAAR ) 100 MG tablet Take 1 tablet (100 mg total) by mouth daily.   sennosides-docusate sodium (SENOKOT-S) 8.6-50 MG tablet Take 1 tablet by mouth daily.   [DISCONTINUED] JANUVIA  100 MG tablet Take 1 tablet by mouth once daily   [DISCONTINUED] meloxicam  (MOBIC ) 15 MG tablet Take 15 mg by mouth.   meloxicam  (MOBIC ) 15 MG tablet Take 1 tablet (15 mg total) by mouth daily.   sitaGLIPtin  (JANUVIA ) 100 MG tablet Take 1 tablet (100 mg total) by mouth daily.   No facility-administered encounter medications on file as of 10/02/2023.    Social History   Tobacco Use   Smoking status: Former    Current packs/day: 0.00    Average packs/day: 0.8 packs/day for 10.0 years (7.5 ttl pk-yrs)    Types: Cigarettes    Start date: 05/23/1975    Quit date: 05/22/1985    Years since quitting: 38.3   Smokeless tobacco: Never  Substance Use Topics   Alcohol use: No   Drug use: No    {History (Optional):23778}  Review of Systems  Constitutional:  Negative for diaphoresis, fever, malaise/fatigue and weight loss.  Respiratory:  Negative for cough, shortness of breath and wheezing.   Cardiovascular:  Negative for chest pain, palpitations, orthopnea, claudication, leg swelling and PND.      Objective:     BP 116/74 (BP Location: Right Arm, Patient Position: Sitting, Cuff Size: Normal)   Pulse (!) 110   Temp 98.7 F (37.1 C) (Oral)   Resp 16   Wt 158 lb 4.8 oz (71.8 kg)   SpO2 (!) 89%   BMI 34.26 kg/m  {Vitals History (Optional):23777}  Physical Exam   No results found for any visits on 10/02/23.  {Labs (Optional):23779}  The 10-year ASCVD risk score (Arnett DK, et al., 2019) is: 40.4%    Assessment & Plan:  Diabetes mellitus with microalbuminuria (HCC) -     SITagliptin  Phosphate; Take 1  tablet (100 mg total) by mouth daily.  Dispense: 90 tablet; Refill: 1  Other orders -     Meloxicam ; Take 1 tablet (15 mg total) by mouth daily.  Dispense: 90 tablet; Refill: 1    No follow-ups on file.    REDDING PONCE NORLEEN FALCON., MD

## 2023-10-03 DIAGNOSIS — M1990 Unspecified osteoarthritis, unspecified site: Secondary | ICD-10-CM | POA: Insufficient documentation

## 2023-10-03 NOTE — Assessment & Plan Note (Signed)
 Chronic, multifactorial.  Ortho assistance appreciated.  She declines gait training thru PT.  Support her the best we can.  Safety proofing and fall precautions reviewed.

## 2023-10-03 NOTE — Assessment & Plan Note (Signed)
 Appropriately on a statin.

## 2023-10-03 NOTE — Assessment & Plan Note (Signed)
Stable  Meloxicam refilled.

## 2023-10-03 NOTE — Assessment & Plan Note (Signed)
 Stable.  Advised to see an eye doctor annually.

## 2023-10-11 NOTE — Telephone Encounter (Signed)
 Completed.

## 2023-10-26 ENCOUNTER — Other Ambulatory Visit: Payer: Self-pay

## 2023-10-26 DIAGNOSIS — I10 Essential (primary) hypertension: Secondary | ICD-10-CM | POA: Insufficient documentation

## 2023-10-26 DIAGNOSIS — M5416 Radiculopathy, lumbar region: Secondary | ICD-10-CM | POA: Insufficient documentation

## 2023-10-26 DIAGNOSIS — E785 Hyperlipidemia, unspecified: Secondary | ICD-10-CM | POA: Insufficient documentation

## 2023-10-26 DIAGNOSIS — Z8673 Personal history of transient ischemic attack (TIA), and cerebral infarction without residual deficits: Secondary | ICD-10-CM | POA: Insufficient documentation

## 2023-10-26 DIAGNOSIS — E1140 Type 2 diabetes mellitus with diabetic neuropathy, unspecified: Secondary | ICD-10-CM | POA: Insufficient documentation

## 2023-10-26 DIAGNOSIS — Z8719 Personal history of other diseases of the digestive system: Secondary | ICD-10-CM | POA: Insufficient documentation

## 2023-10-26 DIAGNOSIS — E118 Type 2 diabetes mellitus with unspecified complications: Secondary | ICD-10-CM | POA: Insufficient documentation

## 2023-10-26 DIAGNOSIS — R2689 Other abnormalities of gait and mobility: Secondary | ICD-10-CM | POA: Insufficient documentation

## 2023-10-27 ENCOUNTER — Encounter: Payer: Self-pay | Admitting: Cardiology

## 2023-10-27 ENCOUNTER — Ambulatory Visit: Attending: Cardiology | Admitting: Cardiology

## 2023-10-27 VITALS — BP 124/70 | HR 92 | Ht <= 58 in | Wt 159.2 lb

## 2023-10-27 DIAGNOSIS — R0609 Other forms of dyspnea: Secondary | ICD-10-CM

## 2023-10-27 DIAGNOSIS — E785 Hyperlipidemia, unspecified: Secondary | ICD-10-CM | POA: Diagnosis not present

## 2023-10-27 DIAGNOSIS — I10 Essential (primary) hypertension: Secondary | ICD-10-CM | POA: Diagnosis not present

## 2023-10-27 DIAGNOSIS — R011 Cardiac murmur, unspecified: Secondary | ICD-10-CM | POA: Diagnosis not present

## 2023-10-27 DIAGNOSIS — R269 Unspecified abnormalities of gait and mobility: Secondary | ICD-10-CM

## 2023-10-27 DIAGNOSIS — G4733 Obstructive sleep apnea (adult) (pediatric): Secondary | ICD-10-CM

## 2023-10-27 NOTE — Progress Notes (Signed)
 Cardiology Office Note:   I am doing fine Date:  10/27/2023   ID:  Tina Ortega, DOB 1945-09-21, MRN [id number]  PCP:  [name], MD  Cardiologist:  Lamar Fitch, MD    Referring MD: [name], MD   No chief complaint on file. I am doing fine  History of Present Illness:    Tina Ortega is a 78 y.o. female past medical history significant for essential hypertension, dyslipidemia, diabetes, diabetic neuropathy was referred to us  because of dizziness as well as heart murmur.  Additional past medical history significant for hemorrhagic stroke.  Since have seen her last time she says she is doing fine and interestingly she never had echocardiogram done she also does not remember seeing me after she remembered being in our office but she is not sure exactly why she was here for.  Complain of having some gait issue dizziness some falls but no passing out.  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   Diabetes mellitus type 2 with complications (HCC)    Diabetes mellitus with microalbuminuria (HCC) 05/22/2013   Diabetic neuropathy (HCC)    Dyslipidemia 05/22/2013   Endometrial thickening on ultrasound 09/17/2021   Essential hypertension, benign 05/22/2013   Gait disorder 07/10/2023   GERD (gastroesophageal reflux disease)    H/O hiatal hernia    History of stroke    (Hemorrhagic), known to Neuro   Hyperlipidemia    Hypertension    white coat syndrome   Hypothyroid 07/26/2023   Impaired functional mobility, balance, gait, and endurance 07/10/2023   Intracranial hemorrhage (HCC) 02/07/2017   Intraparenchymal  hemorrhage of brain (HCC) 10/18/2016   Knee pain, left 07/26/2023   Lap sleeve gastrectomy and repair [facility] Sept 2015 10/01/2013   Lumbar radiculopathy    known to Neuro   Multifactorial gait disorder    known to [facility]   OSA (obstructive sleep apnea) 07/12/2013   NPSG 05/2013:  AHI 15/hr, cpap to 12cm    Formatting of this note might be different from the original. NPSG 05/2013:   AHI 15/hr, cpap to 12cm Last Assessment & Plan: Formatting of this note  might be different from the original. The patient has mild obstructive  sleep apnea by her recent sleep study, but is only mildly symptomatic  overall. The good news here is that her degree of sleep apnea   Osteoarthritis    Knees f/by Dr. 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   Uncontrolled diabetes mellitus with hyperglycemia (HCC) 07/26/2023    Past Surgical History:  Procedure Laterality Date   APPENDECTOMY  1965   carpal tunnel repair Right yrs ago   CESAREAN SECTION  1975, 1977   LAPAROSCOPIC GASTRIC SLEEVE RESECTION N/A 10/01/2013   Procedure: LAPAROSCOPIC GASTRIC SLEEVE RESECTION WITH ENDOSCOPY, HIATAL HERNIA REPAIR ;  Surgeon: Adina Lunger, MD;  Location: [facility];  Service: General;  Laterality: N/A;   OVARIAN CYST SURGERY  1965   TONSILLECTOMY  age 78   and adenoids    Current Medications: Current Meds  Medication Sig   ACCU-CHEK  GUIDE TEST test strip SMARTSIG:Strip(s)   Accu-Chek Softclix Lancets lancets SMARTSIG:Lancet Topical   acetaminophen  (TYLENOL ) 500 MG tablet Take 500 mg by mouth.   aspirin EC 81 MG tablet Take 81 mg by mouth daily. Swallow whole.   atorvastatin  (LIPITOR) 40 MG tablet Take 1 tablet by mouth once daily   Cholecalciferol (VITAMIN D -1000 MAX ST) 25 MCG (1000 UT) tablet Take 1,000 Units by mouth daily.   DULoxetine (CYMBALTA) 60 MG capsule Take 60 mg by mouth every  morning.    glucose blood (ACCU-CHEK AVIVA PLUS) test strip See admin instructions.   hydrochlorothiazide (HYDRODIURIL) 25 MG tablet Take 25 mg by mouth daily.   Lancets Misc. (ACCU-CHEK SOFTCLIX LANCET DEV) KIT See admin instructions.   Lancets MISC See admin instructions.   levothyroxine  (SYNTHROID ) 50 MCG tablet TAKE 1 TABLET BY MOUTH ONCE DAILY BEFORE BREAKFAST   linaclotide (LINZESS) 145 MCG CAPS capsule Take 145 mcg by mouth as needed.   losartan  (COZAAR ) 100 MG tablet Take 1 tablet (100 mg total) by mouth daily.   meloxicam  (MOBIC ) 15 MG tablet Take 1 tablet (15 mg total) by mouth daily.   sennosides-docusate sodium (SENOKOT-S) 8.6-50 MG tablet Take 1 tablet by mouth daily.   sitaGLIPtin  (JANUVIA ) 100 MG tablet Take 1 tablet (100 mg total) by mouth daily.   [DISCONTINUED] Cyanocobalamin (B-12) 5000 MCG SUBL Place 5,000 mcg under the tongue.     Allergies:   Penicillins   Social History   Socioeconomic History   Marital status: Divorced    Spouse name: Not on file   Number of children: Not on file   Years of education: Not on file   Highest education level: Not on file  Occupational History   Occupation: Retired.  Insists on living alone.  Son is nearby.  Tobacco Use   Smoking status: Former    Current packs/day: 0.00    Average packs/day: 0.8 packs/day for 10.0 years (7.5 ttl pk-yrs)    Types: Cigarettes    Start date: 05/23/1975    Quit date: 05/22/1985    Years since quitting: 38.4   Smokeless tobacco: Never  Substance and Sexual Activity   Alcohol use: No   Drug use: No   Sexual activity: Not on file  Other Topics Concern   Not on file  Social History Narrative   Not on file   Social Drivers of Health   Financial Resource Strain: Low Risk  (07/10/2023)   Overall Financial Resource Strain (CARDIA)    Difficulty of Paying Living Expenses: Not very hard  Food Insecurity: No Food Insecurity (07/10/2023)   Hunger Vital Sign    Worried About Running Out of Food in  the Last Year: Never true    Ran Out of Food in the Last Year: Never true  Transportation Needs: No Transportation Needs (07/10/2023)   PRAPARE - Administrator, Civil Service (Medical): No    Lack of Transportation (Non-Medical): No  Physical Activity: Inactive (07/10/2023)   Exercise Vital Sign    Days of Exercise per Week: 0 days    Minutes of Exercise per Session: 0 min  Stress: Stress Concern Present (07/10/2023)   [employer] of Occupational Health - Occupational Stress Questionnaire    Feeling of Stress: Rather much  Social Connections: Not on file     Family History: The patient's family history includes Arthritis in her father and sister; Diabetes in her mother; Heart disease in her mother. ROS:   Please see the history  of present illness.    All 14 point review of systems negative except as described per history of present illness  EKGs/Labs/Other Studies Reviewed:         Recent Labs: 07/26/2023: ALT 20; BUN 28; Creatinine, Ser 0.76; Hemoglobin 13.8; Platelets 252; Potassium 5.1; Sodium 140; TSH 2.410  Recent Lipid Panel    Component Value Date/Time   CHOL 132 07/26/2023 0912   TRIG 86 07/26/2023 0912   HDL 44 07/26/2023 0912   CHOLHDL 3.0 07/26/2023 0912   CHOLHDL 5.0 06/19/2013 1717   VLDL 48 (H) 06/19/2013 1717   LDLCALC 71 07/26/2023 0912    Physical Exam:    VS:  BP 124/70   Pulse 92   Ht 4' 9 (1.448 m)   Wt 159 lb 3.2 oz (72.2 kg)   SpO2 92%   BMI 34.45 kg/m     Wt Readings from Last 3 Encounters:  10/27/23 159 lb 3.2 oz (72.2 kg)  10/02/23 158 lb 4.8 oz (71.8 kg)  08/23/23 164 lb (74.4 kg)     GEN:  Well nourished, well developed in no acute distress HEENT: Normal NECK: No JVD; No carotid bruits LYMPHATICS: No lymphadenopathy CARDIAC: RRR, systolic ejection murmur grade 2/6 best heard right upper portion of the sternum, S2 is still present, no rubs, no gallops RESPIRATORY:  Clear to auscultation without rales, wheezing or  rhonchi  ABDOMEN: Soft, non-tender, non-distended MUSCULOSKELETAL:  No edema; No deformity  SKIN: Warm and dry LOWER EXTREMITIES: no swelling NEUROLOGIC:  Alert and oriented x 3 PSYCHIATRIC:  Normal affect   ASSESSMENT:    1. Dyspnea on exertion   2. Gait disorder   3. Cardiac murmur   4. Dyslipidemia   5. Essential hypertension, benign   6. OSA (obstructive sleep apnea)    PLAN:    In order of problems listed above:  Heart murmur indicating aortic stenosis.  Echocardiogram was not done we will schedule her to have the test.  In the meantime does not have any signs and symptoms of critical aortic stenosis also auscultatory finding not indicating critical stenosis but obviously that need to be clarified with echocardiogram which we will do. Dyslipidemia being followed by internal medicine team I did review KPN which show me data from 7-25 LDL 71 HDL 44 good control continue present management. Essential hypertension blood pressure well-controlled   Medication Adjustments/Labs and Tests Ordered: Current medicines are reviewed at length with the patient today.  Concerns regarding medicines are outlined above.  Orders Placed This Encounter  Procedures   ECHOCARDIOGRAM COMPLETE   Medication changes: No orders of the defined types were placed in this encounter.   Signed, Lamar DOROTHA Fitch, MD, [facility] 10/27/2023 12:57 PM    [facility]

## 2023-10-27 NOTE — Patient Instructions (Signed)
 Medication Instructions:  Your physician recommends that you continue on your current medications as directed. Please refer to the Current Medication list given to you today.  *If you need a refill on your cardiac medications before your next appointment, please call your pharmacy*   Lab Work: None Ordered If you have labs (blood work) drawn today and your tests are completely normal, you will receive your results only by: MyChart Message (if you have MyChart) OR A paper copy in the mail If you have any lab test that is abnormal or we need to change your treatment, we will call you to review the results.   Testing/Procedures: Your physician has requested that you have an echocardiogram. Echocardiography is a painless test that uses sound waves to create images of your heart. It provides your doctor with information about the size and shape of your heart and how well your heart's chambers and valves are working. This procedure takes approximately one hour. There are no restrictions for this procedure. Please do NOT wear cologne, perfume, aftershave, or lotions (deodorant is allowed). Please arrive 15 minutes prior to your appointment time.  Please note: We ask at that you not bring children with you during ultrasound (echo/ vascular) testing. Due to room size and safety concerns, children are not allowed in the ultrasound rooms during exams. Our front office staff cannot provide observation of children in our lobby area while testing is being conducted. An adult accompanying a patient to their appointment will only be allowed in the ultrasound room at the discretion of the ultrasound technician under special circumstances. We apologize for any inconvenience.    Follow-Up: At [facility], you and your health needs are our priority.  As part of our continuing mission to provide you with exceptional heart care, we have created designated Provider Care Teams.  These Care Teams include your  primary Cardiologist (physician) and Advanced Practice Providers (APPs -  Physician Assistants and Nurse Practitioners) who all work together to provide you with the care you need, when you need it.  We recommend signing up for the patient portal called "MyChart".  Sign up information is provided on this After Visit Summary.  MyChart is used to connect with patients for Virtual Visits (Telemedicine).  Patients are able to view lab/test results, encounter notes, upcoming appointments, etc.  Non-urgent messages can be sent to your provider as well.   To learn more about what you can do with MyChart, go to [email or web address].    Your next appointment:   3 month(s)  The format for your next appointment:   In Person  Provider:   Gypsy Balsam, MD    Other Instructions NA

## 2023-11-20 ENCOUNTER — Ambulatory Visit: Attending: Cardiology

## 2023-11-20 DIAGNOSIS — R0609 Other forms of dyspnea: Secondary | ICD-10-CM | POA: Diagnosis not present

## 2023-11-20 LAB — ECHOCARDIOGRAM COMPLETE
AR max vel: 0.59 cm2
AV Area VTI: 0.7 cm2
AV Area mean vel: 0.6 cm2
AV Mean grad: 25.2 mmHg
AV Peak grad: 46.3 mmHg
Ao pk vel: 3.4 m/s
Area-P 1/2: 3.17 cm2
MV VTI: 1.08 cm2
S' Lateral: 2 cm

## 2023-11-22 ENCOUNTER — Ambulatory Visit (HOSPITAL_BASED_OUTPATIENT_CLINIC_OR_DEPARTMENT_OTHER)

## 2023-11-30 ENCOUNTER — Ambulatory Visit: Payer: Self-pay | Admitting: Cardiology

## 2024-02-26 ENCOUNTER — Encounter (HOSPITAL_BASED_OUTPATIENT_CLINIC_OR_DEPARTMENT_OTHER): Payer: Self-pay

## 2024-02-26 ENCOUNTER — Ambulatory Visit (INDEPENDENT_AMBULATORY_CARE_PROVIDER_SITE_OTHER): Admitting: Radiology

## 2024-02-26 ENCOUNTER — Ambulatory Visit (HOSPITAL_BASED_OUTPATIENT_CLINIC_OR_DEPARTMENT_OTHER)
Admission: EM | Admit: 2024-02-26 | Discharge: 2024-02-26 | Disposition: A | Discharge status: Home / Self Care | Source: Home / Self Care

## 2024-02-26 DIAGNOSIS — M545 Low back pain, unspecified: Secondary | ICD-10-CM | POA: Diagnosis not present

## 2024-02-26 DIAGNOSIS — R410 Disorientation, unspecified: Secondary | ICD-10-CM | POA: Diagnosis not present

## 2024-02-26 DIAGNOSIS — R059 Cough, unspecified: Secondary | ICD-10-CM

## 2024-02-26 DIAGNOSIS — J189 Pneumonia, unspecified organism: Secondary | ICD-10-CM | POA: Diagnosis not present

## 2024-02-26 DIAGNOSIS — R350 Frequency of micturition: Secondary | ICD-10-CM

## 2024-02-26 DIAGNOSIS — R3915 Urgency of urination: Secondary | ICD-10-CM | POA: Diagnosis not present

## 2024-02-26 DIAGNOSIS — I69354 Hemiplegia and hemiparesis following cerebral infarction affecting left non-dominant side: Secondary | ICD-10-CM

## 2024-02-26 DIAGNOSIS — R0902 Hypoxemia: Secondary | ICD-10-CM | POA: Diagnosis not present

## 2024-02-26 LAB — POCT URINE DIPSTICK
Bilirubin, UA: NEGATIVE
Glucose, UA: NEGATIVE mg/dL
Ketones, POC UA: NEGATIVE mg/dL
Nitrite, UA: NEGATIVE
POC PROTEIN,UA: 30 — AB
Spec Grav, UA: 1.02
Urobilinogen, UA: 0.2 U/dL
pH, UA: 5.5

## 2024-02-26 MED ORDER — SULFAMETHOXAZOLE-TRIMETHOPRIM 800-160 MG PO TABS: 1.0000 | ORAL_TABLET | Freq: Two times a day (BID) | ORAL | 0 refills | Status: DC

## 2024-02-26 MED ORDER — IPRATROPIUM-ALBUTEROL 0.5-2.5 (3) MG/3ML IN SOLN
3.0000 mL | Freq: Once | RESPIRATORY_TRACT | Status: AC
  Administered 2024-02-26: 3 mL via RESPIRATORY_TRACT

## 2024-02-26 MED ORDER — ALBUTEROL SULFATE HFA 108 (90 BASE) MCG/ACT IN AERS: 2.0000 | INHALATION_SPRAY | RESPIRATORY_TRACT | 0 refills | Status: AC | PRN

## 2024-02-26 MED ORDER — COMPACT SPACE CHAMBER DEVI: 0 refills | Status: AC

## 2024-02-26 MED ORDER — SULFAMETHOXAZOLE-TRIMETHOPRIM 800-160 MG PO TABS: 1.0000 | ORAL_TABLET | Freq: Two times a day (BID) | ORAL | 0 refills | Status: AC

## 2024-02-26 NOTE — ED Provider Notes (Signed)
 " [facility]    CSN: [id number] Arrival date & time: 02/26/24  1436      History   Chief Complaint Chief Complaint  Patient presents with   Back Pain    HPI Tina Ortega is a 79 y.o. female.   79 year old female with lower back pain, weakness, dizziness since approximately 02/19/2024 or earlier.  She reports she has had chronic, intermittent UTIs since she had a stroke in 2017 and worries that she has a UTI right now.  She has chronic urinary frequency and urgency.  She worries the lower back pain is a sign of a UTI.  Her son reports some mental confusion.  Her lower back pain and concerns about a UTI got worse on 02/24/2024 but she has had some worsening of the frequency and urgency of urination since 02/08/2024.  She denies any injury or fall but has had weakness and dizziness since 02/19/2024 or earlier.  She is not aware of any injury to her back and does not do any twisting, turning, heavy lifting.  She denies fever, nausea, vomiting, constipation, diarrhea.  She denies a cough or congestion but her oxygen saturation on room air is 90-92%.  Patient reports that she has had hypoxia intermittently for years and that Dr. Jama (her previous primary care provider) has worked with her to get her lung function improved but she is not on inhalers and does not have home oxygen.  She reports her normal oxygen level is 87-90 percent on room air.   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   Diabetes mellitus type 2 with complications (HCC)    Diabetes mellitus with microalbuminuria (HCC)  05/22/2013   Diabetic neuropathy (HCC)    Dyslipidemia 05/22/2013   Endometrial thickening on ultrasound 09/17/2021   Essential hypertension, benign 05/22/2013   Gait disorder 07/10/2023   GERD (gastroesophageal reflux disease)    H/O hiatal hernia    History of stroke    (Hemorrhagic), known to Neuro   Hyperlipidemia    Hypertension    white coat syndrome   Hypothyroid 07/26/2023   Impaired functional mobility, balance, gait, and endurance 07/10/2023   Intracranial hemorrhage (HCC) 02/07/2017   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 NPSG 05/2013:   AHI 15/hr, cpap to 12cm Last Assessment & Plan: Formatting of this note  might be different from the original. The patient has mild obstructive  sleep apnea by her recent sleep study, but is only mildly symptomatic  overall. The good news here is that her degree of sleep apnea   Osteoarthritis    Knees f/by Dr. 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/07/2016   Uncontrolled diabetes mellitus with hyperglycemia (HCC) 07/26/2023    Patient Active Problem List   Diagnosis Date Noted   Diabetes mellitus type 2 with complications (HCC)    Diabetic neuropathy (HCC)    H/O hiatal hernia    History of stroke    Hyperlipidemia    Hypertension    Lumbar radiculopathy    Multifactorial gait disorder    Osteoarthritis 10/03/2023   Uncontrolled diabetes mellitus with hyperglycemia (HCC) 07/26/2023   Hypothyroid 07/26/2023   Pernicious anemia 07/26/2023   Knee pain, left  07/26/2023   Impaired functional mobility, balance, gait, and endurance 07/10/2023   Gait disorder 07/10/2023   Cardiac murmur 07/10/2023   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   Atherosclerosis 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, bilateral legs (HCC) 11/01/2021   Endometrial thickening on ultrasound 09/17/2021   PMB (postmenopausal bleeding) 09/17/2021   Intracranial hemorrhage (HCC) 02/07/2017   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   Status post gastric bypass for obesity 01/07/2016   Polymyalgia rheumatica 01/07/2016   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   Lap sleeve gastrectomy and repair [facility] Sept 2015 10/01/2013   OSA (obstructive sleep apnea) 07/12/2013   Diabetes mellitus with microalbuminuria (HCC) 05/22/2013   GERD (gastroesophageal reflux disease) 05/22/2013   Dyslipidemia 05/22/2013   Essential hypertension, benign 05/22/2013    Past Surgical History:  Procedure Laterality Date   APPENDECTOMY  1965   carpal tunnel repair Right yrs ago   CESAREAN SECTION  1975, 1977   LAPAROSCOPIC GASTRIC SLEEVE RESECTION N/A 10/01/2013   Procedure: LAPAROSCOPIC GASTRIC SLEEVE RESECTION WITH ENDOSCOPY, HIATAL HERNIA REPAIR ;  Surgeon: Adina Lunger, MD;  Location: [facility];  Service: General;  Laterality: N/A;   OVARIAN CYST SURGERY  1965   TONSILLECTOMY  age 79   and adenoids    OB History   No obstetric history on file.      Home Medications    Prior to Admission medications  Medication Sig Start Date End Date Taking? Authorizing Provider  albuterol  (VENTOLIN  HFA) 108 (90 Base) MCG/ACT inhaler Inhale 2 puffs into the lungs every 4 (four) hours as needed for wheezing or shortness of breath. 02/26/24  Yes Ival Domino, FNP  Spacer/Aero-Holding Chambers (COMPACT SPACE CHAMBER) DEVI Use with the albuterol  inhaler 02/26/24  Yes Ival Domino, FNP  ACCU-CHEK GUIDE TEST test strip SMARTSIG:Strip(s)     [provider]  Accu-Chek Softclix Lancets lancets SMARTSIG:Lancet Topical    [provider]  acetaminophen  (TYLENOL ) 500 MG tablet Take 500 mg by mouth. 12/21/16   [provider]  aspirin EC 81 MG tablet Take 81 mg by mouth daily. Swallow whole.    [provider]  atorvastatin  (LIPITOR) 40 MG tablet Take 1 tablet by mouth once daily 08/11/23   Redding, John F. II, MD  Cholecalciferol (VITAMIN D -1000 MAX ST) 25 MCG (1000 UT) tablet Take 1,000 Units by mouth daily.    [provider]  DULoxetine (CYMBALTA) 60 MG capsule Take 60 mg by mouth every morning.  04/19/13   [provider]  glucose blood (ACCU-CHEK AVIVA PLUS) test strip See admin instructions. 07/21/21   [provider]  Lancets Misc. (ACCU-CHEK SOFTCLIX LANCET DEV) KIT See admin instructions.    [provider]  Lancets MISC See admin instructions. 07/21/21   [provider]  levothyroxine  (SYNTHROID ) 50 MCG tablet TAKE 1 TABLET BY MOUTH ONCE DAILY BEFORE BREAKFAST  08/11/23   [name], MD  linaclotide [facility]) 145 MCG CAPS capsule Take 145 mcg by mouth as needed.    [provider]  losartan  (COZAAR ) 100 MG tablet Take 1 tablet (100 mg total) by mouth daily. 07/26/23   [name], MD  meloxicam  (MOBIC ) 15 MG tablet Take 1 tablet (15 mg total) by mouth daily. 10/02/23   [name], MD  sitaGLIPtin  (JANUVIA ) 100 MG tablet Take 1 tablet (100 mg total) by mouth daily. 10/02/23   [name], MD  sulfamethoxazole -trimethoprim  (BACTRIM  DS) 800-160 MG tablet Take 1 tablet by mouth 2 (two) times daily for 10 days. 02/26/24 03/07/24  Ival Domino, FNP    Family History Family History  Problem Relation Age of Onset   Diabetes Mother    Heart disease Mother    Arthritis Father    Arthritis Sister     Social History Social History[1]   Allergies   Penicillins   Review of Systems Review of Systems  Constitutional:   Negative for chills and fever.  HENT:  Negative for ear pain and sore throat.   Eyes:  Negative for pain and visual disturbance.  Respiratory:  Negative for cough and shortness of breath.   Cardiovascular:  Negative for chest pain and palpitations.  Gastrointestinal:  Negative for abdominal pain, constipation, diarrhea, nausea and vomiting.  Genitourinary:  Negative for dysuria and hematuria.  Musculoskeletal:  Positive for back pain. Negative for arthralgias.  Skin:  Negative for color change and rash.  Neurological:  Positive for dizziness and weakness. Negative for seizures and syncope.  All other systems reviewed and are negative.    Physical Exam Triage Vital Signs ED Triage Vitals [02/26/24 1602]  Encounter Vitals Group     BP 98/61     Girls Systolic BP Percentile      Girls Diastolic BP Percentile      Boys Systolic BP Percentile      Boys Diastolic BP Percentile      Pulse Rate 89     Resp 16     Temp 98.5 F (36.9 C)     Temp Source Oral     SpO2 92 %     Weight      Height      Head Circumference      Peak Flow      Pain Score 8     Pain Loc      Pain Education      Exclude from Growth Chart    No data found.  Updated Vital Signs BP 98/61 (BP Location: Right Arm)   Pulse 89   Temp 98.5 F (36.9 C) (Oral)   Resp 16   SpO2 92%   Visual Acuity Right Eye Distance:   Left Eye Distance:   Bilateral Distance:    Right Eye Near:   Left Eye Near:    Bilateral Near:     Physical Exam Vitals and nursing note reviewed.  Constitutional:      General: She is not in acute distress.    Appearance: She is well-developed. She is ill-appearing. She is not toxic-appearing or diaphoretic.  HENT:     Head: Normocephalic and atraumatic.     Right Ear: Hearing, tympanic membrane, ear canal and external ear normal.     Left Ear: Hearing, tympanic membrane, ear canal and external ear normal.     Nose: No congestion or rhinorrhea.     Right Sinus:  No maxillary sinus  tenderness or frontal sinus tenderness.     Left Sinus: No maxillary sinus tenderness or frontal sinus tenderness.     Mouth/Throat:     Lips: Pink.     Mouth: Mucous membranes are moist.     Pharynx: Uvula midline. No oropharyngeal exudate or posterior oropharyngeal erythema.     Tonsils: No tonsillar exudate.  Eyes:     Conjunctiva/sclera: Conjunctivae normal.     Pupils: Pupils are equal, round, and reactive to light.  Cardiovascular:     Rate and Rhythm: Normal rate and regular rhythm.     Heart sounds: S1 normal and S2 normal. No murmur heard. Pulmonary:     Effort: Pulmonary effort is normal. No respiratory distress.     Breath sounds: Normal breath sounds. No decreased breath sounds, wheezing, rhonchi or rales.     Comments: Breath sounds throughout but oxygen saturation is 91-92% on room air.  Reassessment after DuoNeb treatment: Oxygen saturation is 92-93% on room air after the DuoNeb treatment.  Breath sounds improved and wheezing was significantly reduced. Abdominal:     General: Bowel sounds are normal.     Palpations: Abdomen is soft.     Tenderness: There is no abdominal tenderness. There is no right CVA tenderness, left CVA tenderness, guarding or rebound. Negative signs include Murphy's sign, Rovsing's sign and McBurney's sign.  Musculoskeletal:        General: No swelling.     Cervical back: Normal and neck supple.     Thoracic back: Normal.     Lumbar back: Tenderness (Left lower back and left upper hip or buttock) present. No swelling, edema, deformity, signs of trauma, lacerations, spasms or bony tenderness. Normal range of motion. No scoliosis.  Lymphadenopathy:     Head:     Right side of head: No submental, submandibular, tonsillar, preauricular or posterior auricular adenopathy.     Left side of head: No submental, submandibular, tonsillar, preauricular or posterior auricular adenopathy.     Cervical: No cervical adenopathy.     Right cervical: No superficial  cervical adenopathy.    Left cervical: No superficial cervical adenopathy.  Skin:    General: Skin is warm and dry.     Capillary Refill: Capillary refill takes less than 2 seconds.     Findings: No rash.  Neurological:     Mental Status: She is alert and oriented to person, place, and time.     Cranial Nerves: Cranial nerves 2-12 are intact.     Sensory: Sensation is intact.     Motor: Weakness (Some weakness in left arm and left leg after a stroke several years ago.) present.     Coordination: Coordination is intact.     Gait: Gait abnormal (Patient uses a walker at home.  She is using a wheelchair here because she is unsteady on her feet.).     Deep Tendon Reflexes: Reflexes are normal and symmetric.  Psychiatric:        Mood and Affect: Mood normal.      UC Treatments / Results  Labs (all labs ordered are listed, but only abnormal results are displayed) Labs Reviewed  POCT URINE DIPSTICK - Abnormal; Notable for the following components:      Result Value   Blood, UA moderate (*)    POC PROTEIN,UA =30 (*)    Leukocytes, UA Small (1+) (*)    All other components within normal limits  URINE CULTURE    EKG  Radiology DG Chest 2 View Result Date: 02/26/2024 CLINICAL DATA:  Hypoxia.  Cough. EXAM: CHEST - 2 VIEW COMPARISON:  Radiograph 06/30/2022.  CT 07/01/2022 FINDINGS: Stable heart size and mediastinal contours. Aortic atherosclerosis and tortuosity, stable. Mild bronchial thickening. Subsegmental atelectasis or scarring at the left lung base. No confluent airspace disease. No pneumothorax or pleural effusion. Degenerative change in the spine. IMPRESSION: Mild bronchial thickening. Subsegmental atelectasis or scarring at the left lung base. Electronically Signed   By: Andrea Gasman M.D.   On: 02/26/2024 17:18    Procedures Procedures (including critical care time)  Medications Ordered in UC Medications  ipratropium-albuterol  (DUONEB) 0.5-2.5 (3) MG/3ML nebulizer  solution 3 mL (3 mLs Nebulization Given 02/26/24 1726)    Initial Impression / Assessment and Plan / UC Course  I have reviewed the triage vital signs and the nursing notes.  Pertinent labs & imaging results that were available during my care of the patient were reviewed by me and considered in my medical decision making (see chart for details).  Plan of Care (see discharge instructions for additional patient precautions and education): Community-acquired pneumonia with hypoxia and confusion:  Lung sounds were clear but oxygen saturation was 90-92% on room air.  Breath sounds and oxygen saturation improved after DuoNeb treatment.  X-ray showed atelectasis/possible early pneumonia versus scarring.  Based on patient's condition we will treat for community-acquired pneumonia.  Patient has a penicillin allergy so ceftriaxone given.   Bactrim  DS, 1 pill twice daily for 10 days.   Get plenty of fluids and rest.   Provided an albuterol  inhaler and spacer, 2 puffs, every 4 hours if needed for wheezing. Urinary urgency, frequency with confusion:  UA is slightly abnormal but not clearly showing a UTI.   Urine culture sent.  Will adjust the plan of care, if needed once the culture results.   She will be on Bactrim  DS for possible pneumonia and it would also treat a UTI. Weakness on the left side and lower back pain:  Patient has left hemiparesis after a stroke.  Her back pain is mild and it is really muscular above her left buttock or hip.  Use Tylenol /acetaminophen  if needed for pain. Follow-up with Dr. Dottie in 2 to 3 weeks or sooner if needed  I reviewed the plan of care with the patient and/or the patient's guardian.  The patient and/or guardian had time to ask questions and acknowledged that the questions were answered.  Final Clinical Impressions(s) / UC Diagnoses   Final diagnoses:  Acute left-sided low back pain without sciatica  Urinary urgency  Urinary frequency  Confusion  Hypoxia   Hemiparesis affecting left side as late effect of cerebrovascular accident (CVA) (HCC)  Community acquired pneumonia of left lower lobe of lung     Discharge Instructions      Community-acquired pneumonia with hypoxia and confusion: Lung sounds were clear but oxygen saturation was 90-92% on room air.  Breath sounds and oxygen saturation improved after DuoNeb treatment: X-ray showed atelectasis/possible early pneumonia versus scarring.  Based on patient's condition we will treat for community-acquired pneumonia.  Patient has a penicillin allergy so ceftriaxone given.  Bactrim  DS, 1 pill twice daily for 10 days.  Get plenty of fluids and rest.  Provided an albuterol  inhaler and spacer, 2 puffs, every 4 hours if needed for wheezing.  Urinary urgency, frequency with confusion: UA is slightly abnormal but not clearly showing a UTI.  Urine culture sent.  Will adjust the plan of care, if  needed once the culture results.  She will be on Bactrim  DS for possible pneumonia and it would also treat a UTI.  Weakness on the left side and lower back pain: Patient has left hemiparesis after a stroke.  Her back pain is mild and it is really muscular above her left buttock or hip.  Use Tylenol /acetaminophen  if needed for pain.  Follow-up with Dr. Dottie in 2 to 3 weeks or sooner if needed     ED Prescriptions     Medication Sig Dispense Auth. Provider   albuterol  (VENTOLIN  HFA) 108 (90 Base) MCG/ACT inhaler Inhale 2 puffs into the lungs every 4 (four) hours as needed for wheezing or shortness of breath. 1 each Ival Domino, FNP   Spacer/Aero-Holding Chambers (COMPACT SPACE CHAMBER) DEVI Use with the albuterol  inhaler 1 each Ival Domino, FNP   sulfamethoxazole -trimethoprim  (BACTRIM  DS) 800-160 MG tablet  (Status: Discontinued) Take 1 tablet by mouth 2 (two) times daily for 7 days. 14 tablet Maricruz Lucero, FNP   sulfamethoxazole -trimethoprim  (BACTRIM  DS) 800-160 MG tablet Take 1 tablet by mouth 2 (two)  times daily for 10 days. 20 tablet Ger Ringenberg, FNP      PDMP not reviewed this encounter.    [1]  Social History Tobacco Use   Smoking status: Former    Current packs/day: 0.00    Average packs/day: 0.8 packs/day for 10.0 years (7.5 ttl pk-yrs)    Types: Cigarettes    Start date: 05/23/1975    Quit date: 05/22/1985    Years since quitting: 38.7   Smokeless tobacco: Never  Substance Use Topics   Alcohol use: No   Drug use: No     Ival Domino, FNP 02/26/24 1828  "

## 2024-02-26 NOTE — ED Triage Notes (Signed)
 Patient here today with c/o low back pain, dizziness, and weakness X 1 week. Patient states that she has also had a UTI since having a stroke in 2017.

## 2024-02-26 NOTE — Discharge Instructions (Addendum)
 Community-acquired pneumonia with hypoxia and confusion: Lung sounds were clear but oxygen saturation was 90-92% on room air.  Breath sounds and oxygen saturation improved after DuoNeb treatment: X-ray showed atelectasis/possible early pneumonia versus scarring.  Based on patient's condition we will treat for community-acquired pneumonia.  Patient has a penicillin allergy so ceftriaxone given.  Bactrim  DS, 1 pill twice daily for 10 days.  Get plenty of fluids and rest.  Provided an albuterol  inhaler and spacer, 2 puffs, every 4 hours if needed for wheezing.  Urinary urgency, frequency with confusion: UA is slightly abnormal but not clearly showing a UTI.  Urine culture sent.  Will adjust the plan of care, if needed once the culture results.  She will be on Bactrim  DS for possible pneumonia and it would also treat a UTI.  Weakness on the left side and lower back pain: Patient has left hemiparesis after a stroke.  Her back pain is mild and it is really muscular above her left buttock or hip.  Use Tylenol /acetaminophen  if needed for pain.  Follow-up with Dr. Dottie in 2 to 3 weeks or sooner if needed

## 2024-03-01 ENCOUNTER — Other Ambulatory Visit (HOSPITAL_BASED_OUTPATIENT_CLINIC_OR_DEPARTMENT_OTHER): Payer: Self-pay | Admitting: Family Medicine

## 2024-04-01 ENCOUNTER — Ambulatory Visit (HOSPITAL_BASED_OUTPATIENT_CLINIC_OR_DEPARTMENT_OTHER): Admitting: Family Medicine
